# Patient Record
Sex: Male | Born: 1935 | Race: White | Hispanic: No | Marital: Married | Smoking: Former smoker
Health system: Southern US, Community
[De-identification: ages and names within clinical notes are randomized; demographics above are authoritative.]

## PROBLEM LIST (undated history)

## (undated) DIAGNOSIS — I10 Essential (primary) hypertension: Secondary | ICD-10-CM

## (undated) DIAGNOSIS — E119 Type 2 diabetes mellitus without complications: Secondary | ICD-10-CM

## (undated) DIAGNOSIS — Z87442 Personal history of urinary calculi: Secondary | ICD-10-CM

## (undated) DIAGNOSIS — R160 Hepatomegaly, not elsewhere classified: Secondary | ICD-10-CM

## (undated) DIAGNOSIS — E7800 Pure hypercholesterolemia, unspecified: Secondary | ICD-10-CM

## (undated) DIAGNOSIS — M109 Gout, unspecified: Secondary | ICD-10-CM

## (undated) DIAGNOSIS — K8689 Other specified diseases of pancreas: Secondary | ICD-10-CM

## (undated) DIAGNOSIS — C801 Malignant (primary) neoplasm, unspecified: Secondary | ICD-10-CM

## (undated) HISTORY — PX: ABDOMINAL AORTIC ANEURYSM REPAIR: SUR1152

## (undated) HISTORY — PX: CATARACT EXTRACTION, BILATERAL: SHX1313

## (undated) HISTORY — PX: HERNIA REPAIR: SHX51

## (undated) HISTORY — PX: PORTACATH PLACEMENT: SHX2246

## (undated) HISTORY — DX: Other specified diseases of pancreas: K86.89

---

## 2003-12-24 ENCOUNTER — Ambulatory Visit (HOSPITAL_COMMUNITY): Admission: RE | Admit: 2003-12-24 | Discharge: 2003-12-24 | Payer: Self-pay | Admitting: Gastroenterology

## 2003-12-24 ENCOUNTER — Encounter (INDEPENDENT_AMBULATORY_CARE_PROVIDER_SITE_OTHER): Payer: Self-pay | Admitting: *Deleted

## 2007-05-08 ENCOUNTER — Encounter: Admission: RE | Admit: 2007-05-08 | Discharge: 2007-05-08 | Payer: Self-pay | Admitting: Family Medicine

## 2007-05-09 ENCOUNTER — Ambulatory Visit: Payer: Self-pay | Admitting: Vascular Surgery

## 2007-05-09 ENCOUNTER — Inpatient Hospital Stay (HOSPITAL_COMMUNITY): Admission: AD | Admit: 2007-05-09 | Discharge: 2007-05-18 | Payer: Self-pay | Admitting: Vascular Surgery

## 2007-05-10 ENCOUNTER — Encounter: Payer: Self-pay | Admitting: Vascular Surgery

## 2007-05-11 ENCOUNTER — Encounter: Payer: Self-pay | Admitting: Vascular Surgery

## 2007-06-01 ENCOUNTER — Ambulatory Visit: Payer: Self-pay | Admitting: Vascular Surgery

## 2007-06-06 ENCOUNTER — Ambulatory Visit: Payer: Self-pay | Admitting: Vascular Surgery

## 2007-12-26 ENCOUNTER — Ambulatory Visit: Payer: Self-pay | Admitting: Vascular Surgery

## 2008-11-06 ENCOUNTER — Inpatient Hospital Stay (HOSPITAL_COMMUNITY): Admission: RE | Admit: 2008-11-06 | Discharge: 2008-11-07 | Payer: Self-pay | Admitting: General Surgery

## 2008-11-09 ENCOUNTER — Inpatient Hospital Stay (HOSPITAL_COMMUNITY): Admission: EM | Admit: 2008-11-09 | Discharge: 2008-11-13 | Payer: Self-pay | Admitting: Emergency Medicine

## 2009-01-21 ENCOUNTER — Ambulatory Visit: Payer: Self-pay | Admitting: Vascular Surgery

## 2010-01-29 ENCOUNTER — Ambulatory Visit: Payer: Self-pay | Admitting: Vascular Surgery

## 2010-07-10 LAB — URINALYSIS, ROUTINE W REFLEX MICROSCOPIC
Glucose, UA: NEGATIVE mg/dL
Nitrite: NEGATIVE
Specific Gravity, Urine: 1.023 (ref 1.005–1.030)
pH: 6 (ref 5.0–8.0)

## 2010-07-10 LAB — URINE MICROSCOPIC-ADD ON

## 2010-07-10 LAB — GLUCOSE, CAPILLARY
Glucose-Capillary: 106 mg/dL — ABNORMAL HIGH (ref 70–99)
Glucose-Capillary: 106 mg/dL — ABNORMAL HIGH (ref 70–99)
Glucose-Capillary: 109 mg/dL — ABNORMAL HIGH (ref 70–99)

## 2010-07-10 LAB — DIFFERENTIAL
Basophils Absolute: 0 10*3/uL (ref 0.0–0.1)
Basophils Absolute: 0 10*3/uL (ref 0.0–0.1)
Basophils Absolute: 0 10*3/uL (ref 0.0–0.1)
Basophils Absolute: 0 10*3/uL (ref 0.0–0.1)
Eosinophils Absolute: 0 10*3/uL (ref 0.0–0.7)
Eosinophils Absolute: 0.4 10*3/uL (ref 0.0–0.7)
Eosinophils Relative: 0 % (ref 0–5)
Eosinophils Relative: 1 % (ref 0–5)
Eosinophils Relative: 3 % (ref 0–5)
Lymphocytes Relative: 14 % (ref 12–46)
Lymphocytes Relative: 5 % — ABNORMAL LOW (ref 12–46)
Lymphocytes Relative: 6 % — ABNORMAL LOW (ref 12–46)
Lymphs Abs: 0.5 10*3/uL — ABNORMAL LOW (ref 0.7–4.0)
Monocytes Absolute: 0.4 10*3/uL (ref 0.1–1.0)
Monocytes Absolute: 1 10*3/uL (ref 0.1–1.0)
Monocytes Relative: 6 % (ref 3–12)
Neutro Abs: 4.4 10*3/uL (ref 1.7–7.7)
Neutrophils Relative %: 83 % — ABNORMAL HIGH (ref 43–77)

## 2010-07-10 LAB — BASIC METABOLIC PANEL
BUN: 15 mg/dL (ref 6–23)
BUN: 23 mg/dL (ref 6–23)
CO2: 27 mEq/L (ref 19–32)
CO2: 29 mEq/L (ref 19–32)
Chloride: 103 mEq/L (ref 96–112)
Chloride: 104 mEq/L (ref 96–112)
Creatinine, Ser: 0.85 mg/dL (ref 0.4–1.5)
GFR calc Af Amer: >60 mL/min (ref >60)
GFR calc non Af Amer: >60 mL/min (ref >60)
GFR calc non Af Amer: >60 mL/min (ref >60)
Glucose, Bld: 107 mg/dL — ABNORMAL HIGH (ref 70–99)
Glucose, Bld: 112 mg/dL — ABNORMAL HIGH (ref 70–99)
Glucose, Bld: 74 mg/dL (ref 70–99)
Potassium: 3.1 mEq/L — ABNORMAL LOW (ref 3.5–5.1)
Potassium: 3.7 mEq/L (ref 3.5–5.1)
Potassium: 3.9 mEq/L (ref 3.5–5.1)
Sodium: 136 mEq/L (ref 135–145)
Sodium: 136 mEq/L (ref 135–145)

## 2010-07-10 LAB — COMPREHENSIVE METABOLIC PANEL
AST: 26 U/L (ref 0–37)
Albumin: 3.1 g/dL — ABNORMAL LOW (ref 3.5–5.2)
BUN: 14 mg/dL (ref 6–23)
CO2: 28 mEq/L (ref 19–32)
Chloride: 101 mEq/L (ref 96–112)
Chloride: 89 mEq/L — ABNORMAL LOW (ref 96–112)
Creatinine, Ser: 0.86 mg/dL (ref 0.4–1.5)
Creatinine, Ser: 1.4 mg/dL (ref 0.4–1.5)
GFR calc Af Amer: >60 mL/min (ref >60)
GFR calc non Af Amer: >60 mL/min (ref >60)
Glucose, Bld: 181 mg/dL — ABNORMAL HIGH (ref 70–99)
Potassium: 3.2 mEq/L — ABNORMAL LOW (ref 3.5–5.1)
Total Bilirubin: 0.8 mg/dL (ref 0.3–1.2)
Total Bilirubin: 1.7 mg/dL — ABNORMAL HIGH (ref 0.3–1.2)
Total Protein: 6.2 g/dL (ref 6.0–8.3)

## 2010-07-10 LAB — CBC
HCT: 36.3 % — ABNORMAL LOW (ref 39.0–52.0)
HCT: 37.6 % — ABNORMAL LOW (ref 39.0–52.0)
HCT: 39.5 % (ref 39.0–52.0)
Hemoglobin: 12.4 g/dL — ABNORMAL LOW (ref 13.0–17.0)
MCHC: 32.9 g/dL (ref 30.0–36.0)
MCV: 96.5 fL (ref 78.0–100.0)
MCV: 97.2 fL (ref 78.0–100.0)
MCV: 97.3 fL (ref 78.0–100.0)
MCV: 97.6 fL (ref 78.0–100.0)
Platelets: 165 10*3/uL (ref 150–400)
Platelets: 208 10*3/uL (ref 150–400)
RBC: 4.05 MIL/uL — ABNORMAL LOW (ref 4.22–5.81)
RDW: 13.3 % (ref 11.5–15.5)
RDW: 13.4 % (ref 11.5–15.5)
RDW: 13.5 % (ref 11.5–15.5)
WBC: 5.9 10*3/uL (ref 4.0–10.5)
WBC: 5.9 10*3/uL (ref 4.0–10.5)
WBC: 7.7 10*3/uL (ref 4.0–10.5)

## 2010-07-10 LAB — HEMOGLOBIN A1C
Hgb A1c MFr Bld: 6.3 % — ABNORMAL HIGH (ref 4.6–6.1)
Mean Plasma Glucose: 134 mg/dL

## 2010-07-10 LAB — URINE CULTURE

## 2010-07-10 LAB — CULTURE, BLOOD (ROUTINE X 2): Culture: NO GROWTH

## 2010-08-17 NOTE — H&P (Signed)
[name], [name]                 ACCOUNT NO.:  [id number]   MEDICAL RECORD NO.:  [id number]          PATIENT TYPE:  INP   LOCATION:  0101                         FACILITY:  [facility]   PHYSICIAN:  Corinna L. Lendell Caprice, MDDATE OF BIRTH:  [birth year]   DATE OF ADMISSION:  11/09/2008  DATE OF DISCHARGE:                              HISTORY & PHYSICAL   CHIEF COMPLAINT:  Shortness of breath.   HISTORY OF PRESENT ILLNESS:  [name] is a 77 year old white male who  had a laparoscopic ventral hernia repair on November 06, 2008 by Dr.  Dwain Sarna.  Since then, he has not had any flatus or bowel movement.  He  has not been able to eat much and he has become more bloated.  He denies  nausea, but has had hiccups for 12 hours.  He also in the middle of the  night woke up and was spitting up black material.  He denies emesis but  does report reflux.  It sounds as if while asleep he suddenly became  short of breath with a lot of chest congestion and black material seemed  to be emanating from his chest, mouth and nose.  The details are  somewhat sketchy, but the wife reports that he did seem to have chest  congestion.  He has also developed a cough since last night.  He has had  no fevers or chills.  He had no cough or shortness of breath prior to  last night.  He has an abdominal binder on from the procedure which is  causing some discomfort and shortness of breath due to the abdominal  distention.  When he woke up in the middle of the night, he felt very  weak and dizzy.  On arrival to the emergency room he was found to have a  blood pressure of 81/49 and an oxygen saturation of 81%.  He received 2  liters of fluid and his pressure is now 125/70.  On oxygen, his  saturation is 95%.  He is feeling better but he is still having hiccups.  He denies any nausea currently.  The ED physician spoke with Dr. Gerrit Friends  who is on call for Dr. Dwain Sarna.  He will consult but prefers admission  to medicine.   PAST  MEDICAL HISTORY:  1. Abdominal aortic aneurysm repair in February 2009 with      postoperative ileus, hyperglycemia and hyponatremia at that time.  2. Hypertension.  3. Hyperlipidemia.  4. Gout.  5. Previously diet-controlled diabetes.   MEDICATIONS:  1. Allopurinol 30 mg daily.  2. Lisinopril 20/12.5 ng daily.  3. Verapamil 240 mg daily.  4. Pravastatin 80 mg daily.  5. Zetia 10 mg daily.   ALLERGIES:  No known drug allergies.   SOCIAL HISTORY:  The patient is retired.  He is married.  He does not  drink.  He has no history of drug abuse.  He smoked for a about 10 years  many years ago.   FAMILY HISTORY:  He is adopted.   REVIEW OF SYSTEMS:  As above, otherwise negative.   PHYSICAL  EXAMINATION:  Temperature is 98.5, blood pressure 81/49, pulse  90, respiratory rate 20, oxygen saturation 81% on room air initially,  currently 95%.  Blood pressure currently 125/70, pulse 81, respiratory  rate currently about 16, initially 22.  IN GENERAL:  The patient is hiccupping but otherwise in no acute  distress.  HEENT:  Normocephalic, atraumatic.  Pupils equal, round, reactive to  light.  Sclerae nonicteric.  Moist mucous membranes.  Oropharynx is  without erythema or exudate.  NECK:  Supple.  No JVD.  LUNGS:  Diminished on the right.  No rhonchi, rales or wheeze.  Left  side is clear.  CARDIOVASCULAR:  Regular rate and rhythm without murmurs, gallops or  rubs.  ABDOMEN:  Very distended.  No bowel sounds, soft, minimal tenderness.  No rebound tenderness.  EXTREMITIES:  No clubbing, cyanosis or edema.  GU/RECTAL:  Deferred.  NEUROLOGIC:  Alert and oriented.  Cranial nerves and sensorimotor exams  are intact.  PSYCHIATRIC:  Normal affect.   LABORATORIES:  CBC is unremarkable with 88% neutrophils, 5% lymphocytes.  Sodium 124, on August 3 it was 135.  Potassium 3.2, on August 3 it was  3.8.  Chloride 89, on August 3 it was 101.  Bicarbonate 23, glucose 224,  on August 3 it was 181,  BUN 28, on August 3 it was 14.  Creatinine 1.40,  on August 3 it was 0.86.  Total bilirubin is 1.7, alkaline phosphatase  36, albumin 3.1.  LFTs otherwise unremarkable.  Venous lactic acid level  normal at 2.1.  Urinalysis shows a specific gravity of 1.023, trace  ketones, trace blood, trace protein, negative nitrite, trace leukocyte  esterase, 0-2 white cells, 0-2 red cells.  Urine cultures pending.  Blood cultures pending.  Two views of the chest show new extensive right  lung air space opacities suggesting pneumonia.  Two views of the abdomen  show distended small bowel loops with fluid levels suggesting early  partial obstruction versus ileus, no free air.   ASSESSMENT/PLAN:  1. Pneumonia:  Based on history of an ileus, suspect aspiration.  The      patient has received a dose of vancomycin and Zosyn.  Continue      Zosyn.  Continue oxygen.  His pressure is better and he appears      nontoxic.  His oxygen saturation also is better but I will monitor      on telemetry.  2  Postoperative ileus:  Dr. Gerrit Friends has been consulted.  I will start  Reglan and place an NG tube and correct electrolyte disturbances.  The  patient has not had much pain medication but I will give as needed small  doses of Dilaudid.  He also describes some substernal burning and may  have some reflux or esophagitis and I will give intravenous Protonix.  1. Resolved hypoxemia and hypotension.  2. Dehydration.  3. Hypertension:  Hold antihypertensives.  4. Hyponatremia secondary to dehydration, hydrochlorothiazide with      pneumonia and ileus contributing:  This should correct with saline      and treatment of his above problems as well as stopping his      hydrochlorothiazide.  5. Hyperlipidemia.  Hold statin.  6. Diabetes:  Previously diet-controlled but I suspect his hemoglobin      A1c may be higher than 7.  I will give sliding scale insulin and      check hemoglobin A1c for now.  He may need an oral agent  started at  some point      once he is eating.  7. Hypokalemia.  This will be repleted IV.  Check a magnesium level.  8. History of abdominal aortic aneurysm repair.      Corinna L. Lendell Caprice, MD  Electronically Signed     CLS/MEDQ  D:  11/09/2008  T:  11/09/2008  Job:  [id number]   cc:   Juanetta Gosling, MD  [address]  [place] [address]   Chales Salmon. Abigail Miyamoto, M.D.  Fax: [phone]

## 2010-08-17 NOTE — Op Note (Signed)
[name], [name]                 ACCOUNT NO.:  [id number]   MEDICAL RECORD NO.:  [id number]          PATIENT TYPE:  INP   LOCATION:  2024                         FACILITY:  [facility]   PHYSICIAN:  Janetta Hora. Fields, MD  DATE OF BIRTH:  Mar 27, 1934   DATE OF PROCEDURE:  05/14/2007  DATE OF DISCHARGE:                               OPERATIVE REPORT   PROCEDURE:  Repair of abdominal aortic aneurysm.   PREOPERATIVE DIAGNOSIS:  Abdominal aortic aneurysm.   POSTOPERATIVE DIAGNOSIS:  Abdominal aortic aneurysm.   ANESTHESIA:  General.   ASSISTANT:  Carol Ada, RNFA, Gae Bon, PA-C, Delight Hoh,  RNFA   OPERATIVE FINDINGS:  1. 18 mm Dacron graft.  2. Accessory left renal artery with clamping just above this.   OPERATIVE DETAILS:  After obtaining informed consent, the patient taken  to the operating room.  The patient placed supine position operating  table.  After induction of general anesthesia and endotracheal  intubation, the patient was prepped from the nipples down to level of  the knees.  Next a midline laparotomy incision was made extending from  the xiphoid to the pubis.  This was carried down through subcutaneous  tissues and peritoneum was opened for the full length of the incision.  Next the small bowel was reflected to the right.  The transverse colon  and greater omentum were reflected superiorly.  There was a very large  infrarenal abdominal aortic aneurysm approximately 12 cm in diameter.  Dissection was carried along the retroperitoneum up to the neck of the  aorta.  The left renal vein was identified and retracted superiorly.  There was approximately 2 mm left accessory renal artery coming off the  neck of the aneurysm.  The main renal artery came off just above this.  This was identified and protected.  Right renal artery was also  identified and protected.  Next dissection was carried down to level of  the inferior mesenteric artery.  This was dissected  free  circumferentially and vessel loop placed around this.  Dissection was  then carried down to level of the iliac arteries.  The anterior surface  of the left and right common iliac arteries was dissected free in order  to allow clamping.  The patient was then given 5000 units of intravenous  heparin as well as 25 grams of mannitol.  The left and right common  iliac arteries were then clamped.  Vessel loop was pulled taut on the  inferior mesenteric artery.  The aortic clamp was placed above the left  accessory renal artery but below the left main renal artery.  The  aneurysm was then opened.  There was several lumbar arteries at the  proximal neck which were ligated with 2-0 silk sutures.  There also a  few lumbar arteries at the distal aspect of the aneurysm and these were  controlled with 2-0 silk sutures.  Next an 18 mm Dacron graft was  brought up to the operative field and this was sewn end of graft to the  end of aorta using a running 3-0 Prolene suture.  The graft was sewn  abutting the origin of the inferior pole left renal artery.  At  completion, anastomosis was tested and found be hemostatic.  Clamp was  then removed.  There was a small area of bleeding on the aortic surface  just above the suture on the right side.  This was repaired with a  single 3-0 Prolene pledgeted stitch.  The graft was then clamped below  the anastomosis.  Attention was then turned to the distal anastomosis.  The graft was cut to length and sewn end of graft to the aortic  bifurcation using a running 3-0 Prolene suture.  Just prior to  completion of anastomosis everything was fore bled, back bled and  thoroughly flushed.  Anastomosis was secured, clamp was released with  flow first restored to the right leg with transient drop in blood  pressure.  This quickly recovered.  The left leg was then opened and  also there was transient drop in blood pressure and this quickly  recovered.  Graft was  inspected and found to be hemostatic.  The  inferior mesenteric artery was inspected as the vessel loop was loosened  and there was a vigorous back bleeding from the IMA.  This was then  ligated with 2-0 silk.  The patient was given 70 mg of protamine.  The  patient also been given an additional dose of heparin intravenously  during the case.  After hemostasis had been adequate the aortic sac was  closed with running 3-0 Prolene suture.  The retroperitoneum was also  closed with running 3-0 Prolene suture.  Viscera returned to normal  position.  Fascia was closed with a running #1 PDS suture.  Skin was  closed with staples.  The patient tolerated procedure well and there  were no complications.  Feet were warm at end of the case.  The patient  taken to the recovery room extubated and in stable condition.      Janetta Hora. Fields, MD  Electronically Signed     CEF/MEDQ  D:  05/14/2007  T:  05/15/2007  Job:  [id number]

## 2010-08-17 NOTE — Assessment & Plan Note (Signed)
OFFICE VISIT   TRAYVEN, LUMADUE  DOB:  [birth year]                                       05/09/2007  VHQIO#:[id number]   SUMMARY:  A full history and physical and admit note was dictated into  the [facility] system for this office visit.   Janetta Hora. Fields, MD  Electronically Signed   CEF/MEDQ  D:  05/10/2007  T:  05/11/2007  Job:  [id number]

## 2010-08-17 NOTE — Assessment & Plan Note (Signed)
OFFICE VISIT   [name], [name]  DOB:  [birth year]                                       01/21/2009  ZOXWR#:[id number]   CHIEF COMPLAINT:  Aneurysm followup.   HISTORY OF PRESENT ILLNESS:  The patient is status post repair of  abdominal aortic aneurysm in February of 2009.  He presents today for  further followup.  He denies any abdominal or back pain.  Overall he has  been doing well.  He has returned to his normal activities and his  normal appetite.  His weight is stable overall.  He did have repair of  his ventral hernia by Dr. Dwain Sarna in August of 2010.   Atherosclerotic risk factors continue to remain diabetes, hypertension,  elevated cholesterol.   REVIEW OF SYSTEMS:  Full review of systems was performed and was  positive only for some weight loss since his aneurysm operation.  He is  currently 6 feet 1 inch, 168 pounds.  He also has some easy bleeding  most likely secondary to his aspirin.  All other review of systems were  negative.  Please see intake referral form for details.   FAMILY HISTORY:  Is unremarkable.   SOCIAL HISTORY:  He is married and has three children.  Is a former  smoker and quit over 30 years ago.   PHYSICAL EXAM:  Vital signs:  Blood pressure 163/72 in the left arm,  pulse is 52 and regular.  Temperature is 97.9, oxygen saturation 100% on  room air.  HEENT:  Unremarkable.  Neck:  Has 2+ carotid pulses without  bruit.  Chest:  Clear to auscultation.  He has no cervical  lymphadenopathy.  Cardiac:  Is regular rate and rhythm without murmur.  Abdomen:  Soft, nontender, nondistended.  No hernia with a well-healed  midline laparotomy scar.  Extremities:  He has 2+ femoral pulses with  absent popliteal and pedal pulses bilaterally.  Feet are pink, warm and  well-perfused bilaterally.   He had an aortic ultrasound today which shows that his anastomoses are  patent without any evidence of renarrowing.  ABI on the left side was  1.15, ABI on the right was 0.99 and waveforms were triphasic  bilaterally.   ASSESSMENT:  The patient has recovered from his abdominal aortic  aneurysm repair.  His repair looks good under ultrasound today.  He has  normal ABIs bilaterally.  He will follow up in 1 year.  If everything  looks good at that point we will probably go to once every 5 year  evaluation.   Janetta Hora. Fields, MD  Electronically Signed   CEF/MEDQ  D:  01/22/2009  T:  01/23/2009  Job:  [id number]   cc:   Chales Salmon. Abigail Miyamoto, M.D.

## 2010-08-17 NOTE — H&P (Signed)
NAMEGERRELL, [name]                 ACCOUNT NO.:  [id number]   MEDICAL RECORD NO.:  [id number]          PATIENT TYPE:  OUT   LOCATION:  CATS                         FACILITY:  [facility]   PHYSICIAN:  Janetta Hora. Fields, MD  DATE OF BIRTH:  [birth year]   DATE OF ADMISSION:  05/09/2007  DATE OF DISCHARGE:                              HISTORY & PHYSICAL   HISTORY OF PRESENT ILLNESS:  The patient is a 77 year old male referred  by Dr. Abigail Miyamoto for asymptomatic large abdominal aortic aneurysm.  The  patient was having a routine physical and was noted to have a palpable  pulsatile epigastric mass.  Follow up ultrasound was done yesterday.  It  showed an infrarenal abdominal aortic aneurysm which was 9.4 x 10.2 cm  in diameter.  The patient denies any abdominal or back pain.  His  atherosclerotic risk factors include hypertension and elevated  cholesterol.  He denies a history of coronary artery disease.  He does  have a history of smoking but quit in 1971.  He has a history of  diabetes.  He does not take insulin.   FAMILY HISTORY:  He has no family history of abdominal aortic aneurysm  but was adopted and does not know his complete family history.   SOCIAL HISTORY:  He is married and has four children.  He is a retired  Airline pilot.  Smoking history is as above.  He does not consume alcohol  regularly.   REVIEW OF SYSTEMS:  He is 6 foot 1, 184 pounds.  He is able to climb two  flights of stairs without shortness of breath.  He has no chest pain or  tightness.  He has no history of asthma, wheezing, GI bleeding, peptic  ulcer disease, stroke, TIA, syncopal episodes or seizures.  He does have  a history of urinary frequency and had some microscopic hematuria on a  urinalysis recently.  Denies any bleeding problems.   PAST SURGICAL HISTORY:  Remarkable for a cataract in his left eye.   ALLERGIES:  HE HAS NO KNOWN DRUG ALLERGIES.   MEDICATIONS:  Include:  1. Pravachol 40 mg once a day.  2.  Zestoretic/lisinopril 20/12.5 once a day.  3. Calan SR 240 mg once a day.  4. Allopurinol 300 mg once a day.   PHYSICAL EXAMINATION:  VITAL SIGNS:  Blood pressure is 156/89 in the  right arm, pulse is 75 and regular.  HEENT:  Unremarkable.  NECK:  2+ carotid pulses without bruits.  CHEST:  Clear to auscultation.  CARDIAC EXAM:  Regular rate and rhythm without murmur.  ABDOMEN:  Soft, nontender, nondistended with an easily palpable  pulsatile mass in the epigastrium.  This is consistent with a 10 cm  aneurysm.  It is nontender.  He has 3+ femoral, 2+ popliteal pulses  bilaterally.  He has a 3+ right posterior tibial pulse with absent right  dorsalis pedis pulse.  He has absent pedal pulses in the left foot.  He  has bilateral varicose veins with no significant edema in the lower  extremities.   ASSESSMENT/PLAN:  I reviewed  his ultrasound of the abdomen  which shows  a large abdominal aortic aneurysm.  It appears to be infrarenal in  character and ends at the aortic bifurcation.   I had a lengthy discussion with [name] today regarding repair of his  abdominal aortic aneurysm.  I described to him endovascular and open  repair methods.  I told him that first we would need to get a CT scan of  the abdomen and pelvis.  I also informed him we would bring him in as an  inpatient to evaluate this and also do his remaining workup for his  aneurysm repair as an inpatient due to the large size of the aneurysm.  I also discussed with him the risk of rupture of a 10 cm aneurysm.  The  risks, benefits, possible complications and procedure details of  endovascular stent grafting as well as open repair were discussed with  the patient.  I will discuss with him further tomorrow, as we have the  CT angiogram, on which he would be a best candidate for.  Tentatively,  he is scheduled for operative repair of this on Friday, February 6.      Janetta Hora. Fields, MD  Electronically Signed      CEF/MEDQ  D:  05/09/2007  T:  05/09/2007  Job:  [id number]   cc:   Chales Salmon. Abigail Miyamoto, M.D.

## 2010-08-17 NOTE — Assessment & Plan Note (Signed)
OFFICE VISIT   [name], [name]  DOB:  1933/05/05                                       05/09/2007  ZOXWR#:[id number]   A full history and physical was dictated into the [facility] system for this  office visit.   Janetta Hora. Fields, MD  Electronically Signed   CEF/MEDQ  D:  05/10/2007  T:  05/11/2007  Job:  [id number]

## 2010-08-17 NOTE — Procedures (Signed)
DUPLEX ULTRASOUND OF ABDOMINAL AORTA   INDICATION:  Followup abdominal aortic aneurysm repair.   HISTORY:  Diabetes:  Yes.  Cardiac:  No.  Hypertension:  Yes.  Smoking:  Previous.  Connective Tissue Disorder:  Family History:  No.  Previous Surgery:  Open repair of AAA on 05/14/2007.   DUPLEX EXAM:         AP (cm)                   TRANSVERSE (cm)  Proximal             Not visualized            Not visualized  Mid                  2.7 cm                    2.7 cm  Distal               2.8 cm                    2.8 cm  Right Iliac          1.5 cm                    cm  Left Iliac           1.3 cm                    cm   PREVIOUS:  Date:  AP:  TRANSVERSE:   IMPRESSION:  1. Patent abdominal aortic aneurysm repair site with no evidence of      stenosis or aneurysmal dilatation of the native vessels.  2. Unable to adequately visualize the proximal aorta due to overlying      bowel gas patterns.   ___________________________________________  Janetta Hora Fields, MD   CH/MEDQ  D:  02/01/2010  T:  02/01/2010  Job:  [id number]

## 2010-08-17 NOTE — Assessment & Plan Note (Signed)
OFFICE VISIT   TYKE, OUTMAN  DOB:  [birth year]                                       12/26/2007  ZOXWR#:[id number]   The patient returns for followup today.  He had repair of an abdominal  aortic aneurysm in February of 2009.  He has completely recovered from  this at this point and is back to his normal activities.  He denies any  abdominal or back pain.   PHYSICAL EXAMINATION:  On physical exam today blood pressure is 144/74  in the left arm, pulse is 60 and regular.  Abdomen is soft, nontender,  nondistended.  He has an approximately 3 cm hernia defect approximately  4 cm above the umbilicus.  There is no evidence of incarceration and it  spontaneously reduces.  It is not painful to him.  He has 2+ femoral  pulses bilaterally.  Chest is clear to auscultation.  Cardiac exam is  regular rate and rhythm.   Overall, the patient is doing well.  Since his hernia is not very large  and it is asymptomatic at this point I have counseled him that we should  leave it alone for now.  He is okay with this.  He will follow up with  me in one year's time or sooner if the hernia becomes larger.   Janetta Hora. Fields, MD  Electronically Signed   CEF/MEDQ  D:  12/26/2007  T:  12/27/2007  Job:  [id number]   cc:   Chales Salmon. Abigail Miyamoto, M.D.

## 2010-08-17 NOTE — Discharge Summary (Signed)
[name], [name]                 ACCOUNT NO.:  [id number]   MEDICAL RECORD NO.:  [id number]          PATIENT TYPE:  INP   LOCATION:  2024                         FACILITY:  [facility]   PHYSICIAN:  Janetta Hora. Fields, MD  DATE OF BIRTH:  1933/12/08   DATE OF ADMISSION:  05/09/2007  DATE OF DISCHARGE:  05/18/2007                               DISCHARGE SUMMARY   ADMISSION DIAGNOSIS:  Large abdominal aortic aneurysm.   DISCHARGE/SECONDARY DIAGNOSES:  1. Large infrarenal abdominal aortic aneurysm, 10 cm, status post open      repair.  2. Postoperative ileus, clinically improved.  3. Postoperative hyperglycemia with history of diet-controlled      diabetes mellitus type 2.  4. Hypokalemia, supplemented.  5. Leukocytosis, most likely reactive, improved.  6. History of tobacco use, but quit in 1971.  7. History of hypertension.  8. Hyperlipidemia.  9. No known drug allergies.  10.History of left eye surgery for cataracts.  11.Postoperative respiratory alkalosis, clinically improved.  12.Hyponatremia.   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   BRIEF HISTORY:  [name] a 77 year old Caucasian male referred to Dr.  Fabienne Bruns by Dr. Abigail Miyamoto for an asymptomatic large abdominal aortic  aneurysm.  The patient was having a routine physical and was noted to  have a palpable pulsatile epigastric mass.  Followup ultrasound showed  an infrarenal abdominal aortic aneurysm measuring 9.4 x 10.2 cm in  diameter.  The patient had no abdominal or back pain.  Medical history  as above. He was referred to a vascular surgeon, Dr. Fabienne Bruns on  May 09, 2007, the day after his ultrasound due to its size.  Dr.  Darrick Penna recommended that he be admitted to [facility] and  undergo remaining work up to see if he was a candidate for endovascular  versus open repair and  also due to risk of rupture with known centimeter  aneurysm.   HOSPITAL COURSE:  Mr. [name] was admitted to [facility] on  May 09, 2007.  He underwent a CTA of the abdomen and pelvis which  showed a huge abdominal aortic aneurysm in the infrarenal location and  measuring 10.5 and 11.6 cm in greatest dimension.  No significant neck  was present above the aneurysm to the level of the lowest renal artery  which was a lower pole left renal artery that originated 5 mm proximal  to the origin of the aneurysm.  Two left renal arteries were present and  2 of 3 right renal arteries were present.  Reports that it was difficult  to determine whether 2 small accessory vessels originate separately off  of the abdominal aorta on the right, or whether one might originate  nearly simultaneously within the main renal artery.  None of the right  renal arteries was located near the origin of the aneurysm.  No other  significant distal vessels were identified.  Inferior mesenteric artery  remained open.  There is no signs of aneurysmal  leak or inflammatory  aneurysm.  The iliac arteries was noted to have tortuosity without  stenosis or occlusion.  Common iliac arteries were mildly and diffusely  dilated measuring 15 mm in estimated maximal diameter just above their  origins.  Based on these results it was felt that he was not a candidate  for stent graft, and therefore would undergo open repair.  In the  meantime, he had postoperative ABIs which showed 1.23 on the right and  1.23 on the left.  He also had a 2-D echocardiogram, which showed  overall left ventricular systolic function was normal, left ventricular  ejection fraction was estimated at 60%.  There was no left ventricular  regional wall motion abnormalities.  The left ventricular wall thickness  was moderately increased.  There is mild aortic root dilatation.  On  May 11, 2007, Mr. [name] was taken to the operating room as   anticipated.  Of note, intra-aortic clamped was clamped just above the  left accessory renal.  Postoperatively he was extubated in the operating  room, but after about 2 hours in the recovery unit he developed  increasing agitation which was treated with Dilaudid and Versed.  He  later became lethargic with decreased respirations.  SAO2 was decreased  to 50% and therefore he was reintubated in the recovery unit by  anesthesia when his saturation recovered promptly to 100%.  He was then  transferred to the Surgical Intensive Care Unit.  He was treated for  respiratory alkalosis on SIMV overnight in the unit.  On postop day #1,  however, he was felt appropriate for extubation.  Labs did show some  hypokalemia and hyponatremia which were supplemented.  Otherwise his  vitals remained stable.  Hemoglobin was at 11.  Urine output was  adequate.  Oxygen saturations were above 90%.  Feet remained warm and  abdomen was soft, no bowel sounds present initially.  On postop day #2  he was felt appropriate to transfer to the telemetry unit 2000 where it  was anticipated he will remain until discharge.  At that point he was  still putting out a fair amount of stomach secretions and the  nasogastric tube was kept in place.  Abdomen was distended still with no  good bowel sounds; therefore it was felt that he had an ileus.  He  remained on NPO status with IV fluids for hydration.  By postoperative  day #5, however, nasogastric tube output was improving, abdomen was no  longer distended, and bowel sounds were present.  He also had a  postoperative bowel movement.  Subsequently the nasogastric tube was  discontinued.  After several hours he had no nausea or vomiting or  increased distention, and he was advanced to a clear liquid diet.  On  postop day #6 he was then advanced to a regular diet.  If he tolerates a  regular diet we anticipate he will be ready for discharge within the  next 24-to-48 hours.  Of  note during this hospitalization he was also  monitored for leukocytosis with a white count up to about 16,000.  There  was, however, no signs of infection and serial WBCs showed a trend and  most recently down to 11.6.  He remained afebrile and was felt the  transient elevation was most likely reactive related to surgery.  Of  note with his history of diabetes, he was treated with NovoLog insulin  sliding scale throughout his hospitalization.  His blood sugars have  averaged  around 120 to 140.  He will be instructed to follow up with  family physician regarding possible need to start on long-term  medication.  As far as his hyponatremia, this did improve.  However, he  did require several other supplements of potassium.  At the time of  dictation his incisions were healing well without signs of infection.  Abdomen is soft, nontender with good bowel sounds.  Vitals remained  stable showing blood pressure of 118/60, heart rate in the 60s to 80s in  sinus rhythm.  He is afebrile and oxygen saturation 95% on room air.  He  reports that he is comfortable.  He has been ambulating without  difficulty.  His bowel and bladder function has been returning.  Of note  he did report some mild sore throat and was his pharyngitis treated with  Chloraseptic spray as needed as did not appear to have an acute  infection.  There was no evidence of thrush or significant erythema or  any white patches noted.   DISPOSITION:  If no significant change in status, anticipated Mr. Seeley  will be discharged postoperative day #7 or #8, February 13 or May 19, 2007.  Currently he remains in stable condition.   Most recent labs:  His February 11 lab results showed a white count of  11.6, hemoglobin 11, hematocrit 32.6, platelet count of 219.  Sodium  136, potassium 3.7, chloride 105, CO2 26, BUN 12, creatinine 0.69, blood  glucose 117.  Immediate postoperative LFTs showed an AST of 19, ALT of  50, alkaline  phosphatase of 35, total bilirubin 0.8, and amylase of 133,  albumin 2.   DISCHARGE MEDICATIONS:  1. Allopurinol 300 mg daily.  2. Calan 240 mg daily.  3. Pravachol 40 mg daily.  4. Zetia 20 mg daily.  5. Aspirin 81 mg daily.  6. Lisinopril 20 mg daily.  7. Hydrochlorothiazide 12.5 mg daily.  8. Percocet 5/325 mg 1 tablet p.o. every 6 hours p.r.n. pain.  9. Pepcid 20 mg p.o. every 12 hours, may decrease to as needed status      as tolerated.   DISCHARGE INSTRUCTIONS:  Continue preoperative diet.  Increase activity  as tolerated.  May shower, clean incision with soap and water.  Avoid  driving or heavy lifting for the next 3 weeks.  Call if he develops  fever greater than 101, redness or drainage from his incision site,  persistent nausea or vomiting or abdominal pain.  See Dr. Darrick Penna at the  [facility] office in approximately 3 weeks with ABIs.  Have staple removal on  May 28, 2007.  Our office will contact him regarding specific  appointment date and times.  He should also call and schedule followup  appointment with Dr. Abigail Miyamoto within the next month to reevaluate his  blood sugar.      Jerold Coombe, P.A.      Janetta Hora. Fields, MD  Electronically Signed    AWZ/MEDQ  D:  05/17/2007  T:  05/19/2007  Job:  [id number]   cc:   Janetta Hora. Darrick Penna, MD  Chales Salmon. Abigail Miyamoto, M.D.

## 2010-08-17 NOTE — Procedures (Signed)
AORTA-ILIAC DUPLEX EVALUATION   INDICATION:  Followup of AAA stent.   HISTORY:  Diabetes:  Yes.  Cardiac:  No.  Hypertension:  Yes.  Smoking:  Previous.  Previous Surgery:  AAA stent.               SINGLE LEVEL ARTERIAL EXAM                              RIGHT                  LEFT  Brachial:                  137                    128  Anterior tibial:           113                    129  Posterior tibial:          135                    158  Peroneal:  Ankle/brachial index:      0.99                   1.15  Previous ABI/date:         1.28                   1.29   AORTA-ILIAC DUPLEX EXAM  Aorta - Proximal     2.8 x 2.6 cm/s  Aorta - Mid          3 x 2.7 cm/s  Aorta - Distal       3.1 x 2.9 cm/s   RIGHT                                   LEFT  0.99 cm/s         CIA-PROXIMAL          0.95 cm/s  0.45 cm/s         CIA-DISTAL            0.97 cm/s  cm/s              HYPOGASTRIC           cm/s  0.95 cm/s         EIA-PROXIMAL          0.45 cm/s  cm/s              EIA-MID               cm/s  0.64 cm/s         EIA-DISTAL            1.08 cm/s   IMPRESSION:  1. Abdominal aortic aneurysm iliac stent appears patent with no      evidence of stenosis.  2. Ankle brachial indices within normal limits.  3. Hypogastric atery not visualized due to acoustic shadowing.      ___________________________________________  Janetta Hora. Fields, MD   CJ/MEDQ  D:  01/21/2009  T:  01/21/2009  Job:  [id number]

## 2010-08-17 NOTE — Op Note (Signed)
NAMEDEMONTREZ, RINDFLEISCH                 ACCOUNT NO.:  [id number]   MEDICAL RECORD NO.:  [id number]          PATIENT TYPE:  INP   LOCATION:  0006                         FACILITY:  [facility]   PHYSICIAN:  Juanetta Gosling, MDDATE OF BIRTH:  [birth year]   DATE OF PROCEDURE:  11/06/2008  DATE OF DISCHARGE:                               OPERATIVE REPORT   PREOPERATIVE DIAGNOSIS:  Ventral hernia.   POSTOPERATIVE DIAGNOSIS:  Ventral hernia.   PROCEDURE:  Laparoscopic ventral hernia repair with 20 x 25 cm Proceed  mesh.   SURGEON:  Harden Mo, M.D.   ASSISTANT:  Marca Ancona, M.D.   ANESTHESIA:  General.   FINDINGS:  An 8 x 10 cm ventral wall defect.   SPECIMENS:  None.   DRAINS:  None.   SUPERVISING ANESTHESIOLOGIST:  Dr. Brayton Caves.   ESTIMATED BLOOD LOSS:  Minimal.   COMPLICATIONS:  None.   DISPOSITION:  To recovery room in stable condition.   INDICATIONS:  Mr. Janoski is a 77 year old male with underwent an aneurysm  repair by Dr. Darrick Penna in February, 2009 and was noted to have an increase  in size of the abdominal vault around his incision.  He is wearing a  binder to help the symptoms of pain at this time and is increasing in  size.  He does complain if he eats too much, it will become hard and  tender, but he is able to reduce this every time it happens.  On his  examination, he had about an 8 x 4 cm reducible incisional hernia.  He  and I discussed a laparoscopic ventral hernia repair and the risks and  benefits of that procedure in detail prior to operation.   PROCEDURE:  After informed consent was obtained, the patient was taken  to the operating room.  He was administered 1 gram of intravenous  cefazolin.  Sequential compression devices were placed on the lower  extremities prior to induction.  He was then placed under general  endotracheal anesthesia without complication.  Foley catheter and  orogastric tube were then placed.  His abdomen was prepped with  ChloraPrep.  Three minutes were allowed to pass.  He was then draped in  a standard sterile surgical fashion.  Collier Flowers was then also placed over  these drapes.  A surgical time-out was then performed.   A 10-mm incision was then made in the left upper quadrant.  Dissection  carried out down to the level of external abdominal oblique.  This was  entered with electrocautery.  The peritoneum was then entered bluntly  with a finger.  A balloon-tipped trocar was then inserted in the  abdomen.  The abdomen was then insufflated at 15 mmHg pressure.  Camera  was inserted.  There was no evidence of any entry injury.  Two further 5-  mm trocars were placed in the left side of the abdomen under direct  vision without complication after infiltration with local anesthetic.  He had some adhesions in his left lower quadrant that were lysed  sharply.  These were just fatty adhesions.  He also had  a fair amount of  adhesions to his falciform.  There was 1 loop of bowel that was up near  his abdominal wall that was taken down sharply.  The falciform was taken  down with some clips.  Hemostasis was observed.  All these areas were  also observed for enterotomies following the lysis of the adhesions, and  they were without any injuries.  The defect was then measured at about 8  x 10 cm.  It was more of a Swiss cheese defect.  I did elect to cover  his entire incision.  I measured this out, and this ended up being the  right about the size of a 20 x 25 cm piece of Proceed mesh.  I added  additional trocar in the right side of the abdomen that was placed under  direct vision without complication.  Following this, I then placed a  grasper through my 10-mm port and pulled the mesh in after this.  I  placed 4 sutures of 0 Prolene in the cardinal positions in that mesh  prior to doing this.  The mesh was unrolled.  The Endoclose device was  then used to secure the mesh in all 4 positions without difficulty.  The   mesh appeared to lie flat after this.  I then used the AbsorbaTack and  used 40 tacks to secure the edges of the mesh.  Following this, I then  bisected each of my prior sutures with an additional 0 Prolene suture  using the Storz suture passer.  This was done under direct vision  without any evidence of any injury.  The mesh appeared to be in good  position following this. Hemostasis was observed on the areas of  adhesions.  Again, there were no injuries noted.  I then removed my  balloon-tipped trocar, and I used a 0 Vicryl suture x2 to close the hole  using the Endoclose device.  Following this, the abdomen was then  desufflated.  All trocars were removed.  The incisions were closed with  4-0 Monocryl in a subcutaneous tissue fashion.  All these stab incisions  for the suture were closed with Dermabond as well as the other  incisions.  He tolerated this well.  His Foley catheter will remain in  overnight.  He had an abdominal binder placed upon completion.  He was  extubated in the operating and was transferred to the recovery room in  stable condition.      Juanetta Gosling, MD  Electronically Signed     MCW/MEDQ  D:  11/06/2008  T:  11/06/2008  Job:  [id number]   cc:   Janetta Hora. Fields, MD  [address], [place] [address]

## 2010-08-17 NOTE — Assessment & Plan Note (Signed)
OFFICE VISIT   OAKLEN, THIAM  DOB:  November 03, 1933                                       06/06/2007  QQVZD#:[id number]   The patient returns for followup today after repair of the abdominal  aortic aneurysm on February 9.  He has recovered well from this.  He is  having normal bowel movements and his appetite is returning.  He is also  returning to his normal physical activities.   He did have an episode of diarrhea recently.  There was no blood  involved in this and this resolved in a few days.  He thinks it was  viral in nature.  He is feeling well now.   PHYSICAL EXAM:  Blood pressure 104/63, heart rate 74 and regular.  Abdomen is soft and nontender.  There is no evidence of hernia.  Incision is well-healed.  He has 2+ femoral pulses.  He has a 2+ left  dorsalis pedis pulse and a 1+ right dorsalis pedis pulse.  Overall, the  patient seems to be recovering well.  He will follow up with me in 6  months' time.  If he is doing well at that time, we will space his  followup out further.   Janetta Hora. Fields, MD  Electronically Signed   CEF/MEDQ  D:  06/06/2007  T:  06/07/2007  Job:  [id number]   cc:   Chales Salmon. Abigail Miyamoto, M.D.

## 2010-08-20 NOTE — Op Note (Signed)
NAMESHREY, BOIKE                 ACCOUNT NO.:  [id number]   MEDICAL RECORD NO.:  [id number]          PATIENT TYPE:  AMB   LOCATION:  ENDO                         FACILITY:  [facility]   PHYSICIAN:  Petra Kuba, M.D.    DATE OF BIRTH:  04-Dec-1933   DATE OF PROCEDURE:  12/24/2003  DATE OF DISCHARGE:                                 OPERATIVE REPORT   PROCEDURE:  Colonoscopy.   INDICATION:  Screening.  Consent was signed after risks, benefits, methods,  options thoroughly discussed in the office.   MEDICINES USED:  1.  Demerol 50.  2.  Versed 6.   DESCRIPTION OF PROCEDURE:  Rectal inspection was pertinent for external  hemorrhoids, small.  The digital exam was negative.  Video pediatric  adjustable colonoscope was inserted and with some difficulty due to poor  prep, was able to be advanced to the cecum.  This did require some abdominal  pressure but no position changes.  Cecum was identified by the appendiceal  orifice and the ileocecal valve.  Left greater than right diverticula was  seen on insertion but no other obvious abnormality.  Scope was slowly  withdrawn.  There was formed stool in the cecum and right colon which could  not be washed and suctioned out and other parts of colon where we could not  wash and suction.  Overall, poor visualization was obtained but no obvious  mass lesions were seen.  Other than the diverticula, a tiny descending polyp  was seen and was cold biopsied x 2.  No other abnormalities were seen as we  slowly withdrew back to the rectum.  Anorectal pull-through and retroflexion  confirmed some small hemorrhoids.  The scope was straightened and readvanced  a short ways up the left side of the colon; air was suctioned, scope  removed.  The patient tolerated the procedure well.  There was no obvious  immediate complication.   ENDOSCOPIC DIAGNOSES:  1.  Internal/external hemorrhoids.  2.  Left greater than right diverticula.  3.  Tiny descending polyp seen  and cold biopsied.  4.  Otherwise, within normal limits to the cecum but with a poor prep, and      there could have been lesions missed.   PLAN:  I would like to repeat this in 1 year using a more aggressive prep  including mag citrate and GoLYTELY and then if normal and no other lesions,  might not need another screening before 10 years.  Happy to see back sooner  p.r.n..  1.  Otherwise, return care to Dr. Idell Pickles for the customary health care      maintenance to include yearly rectals and guaiacs.      MEM/MEDQ  D:  12/24/2003  T:  12/24/2003  Job:  [id number]   cc:   Dellis Anes. Idell Pickles, M.D.  [address]  [place]  [place] [address]  Fax: [phone]

## 2010-12-24 LAB — POCT I-STAT 3, ART BLOOD GAS (G3+)
Acid-Base Excess: 2
Bicarbonate: 22
O2 Saturation: 99
O2 Saturation: 99
Patient temperature: 36.7
TCO2: 23
TCO2: 23
pCO2 arterial: 22.3 — ABNORMAL LOW
pH, Arterial: 7.48 — ABNORMAL HIGH

## 2010-12-24 LAB — POCT I-STAT 7, (LYTES, BLD GAS, ICA,H+H)
Acid-Base Excess: 3 — ABNORMAL HIGH
Acid-base deficit: 2
Bicarbonate: 23.3
Calcium, Ion: 1.19
HCT: 28 — ABNORMAL LOW
HCT: 36 — ABNORMAL LOW
Hemoglobin: 9.5 — ABNORMAL LOW
O2 Saturation: 100
Operator id: 119851
Sodium: 133 — ABNORMAL LOW
Sodium: 135
TCO2: 24
pO2, Arterial: 385 — ABNORMAL HIGH

## 2010-12-24 LAB — CROSSMATCH

## 2010-12-24 LAB — CBC
HCT: 32.2 — ABNORMAL LOW
HCT: 32.6 — ABNORMAL LOW
Hemoglobin: 11 — ABNORMAL LOW
Hemoglobin: 11 — ABNORMAL LOW
Hemoglobin: 11.1 — ABNORMAL LOW
MCHC: 34.1
MCHC: 34.1
MCV: 93.3
Platelets: 157
Platelets: 195
Platelets: 219
RBC: 3.46 — ABNORMAL LOW
RBC: 3.47 — ABNORMAL LOW
RBC: 3.5 — ABNORMAL LOW
RBC: 4.24
RDW: 13.1
RDW: 13.6
WBC: 11.6 — ABNORMAL HIGH
WBC: 13.5 — ABNORMAL HIGH
WBC: 13.9 — ABNORMAL HIGH
WBC: 15.9 — ABNORMAL HIGH
WBC: 9.7

## 2010-12-24 LAB — CREATININE, SERUM: GFR calc non Af Amer: >60

## 2010-12-24 LAB — BASIC METABOLIC PANEL
BUN: 12
BUN: 12
BUN: 15
CO2: 26
CO2: 28
CO2: 31
Calcium: 7.9 — ABNORMAL LOW
Calcium: 8 — ABNORMAL LOW
Calcium: 8.1 — ABNORMAL LOW
Calcium: 9.5
Chloride: 105
Chloride: 98
Creatinine, Ser: 0.69
GFR calc Af Amer: >60
GFR calc Af Amer: >60
GFR calc non Af Amer: >60
GFR calc non Af Amer: >60
Glucose, Bld: 117 — ABNORMAL HIGH
Glucose, Bld: 127 — ABNORMAL HIGH
Glucose, Bld: 147 — ABNORMAL HIGH
Potassium: 3.7
Sodium: 136
Sodium: 136
Sodium: 138
Sodium: 140

## 2010-12-24 LAB — POCT I-STAT 4, (NA,K, GLUC, HGB,HCT)
HCT: 31 — ABNORMAL LOW
Hemoglobin: 10.5 — ABNORMAL LOW
Sodium: 134 — ABNORMAL LOW

## 2010-12-24 LAB — COMPREHENSIVE METABOLIC PANEL
ALT: 15
ALT: 24
AST: 19
AST: 21
Alkaline Phosphatase: 35 — ABNORMAL LOW
Alkaline Phosphatase: 53
CO2: 25
CO2: 25
Chloride: 95 — ABNORMAL LOW
Chloride: 99
GFR calc Af Amer: >60
GFR calc Af Amer: >60
GFR calc non Af Amer: >60
GFR calc non Af Amer: >60
Glucose, Bld: 203 — ABNORMAL HIGH
Potassium: 3.1 — ABNORMAL LOW
Sodium: 128 — ABNORMAL LOW
Sodium: 128 — ABNORMAL LOW
Total Bilirubin: 0.7
Total Bilirubin: 0.8

## 2010-12-24 LAB — BLOOD GAS, ARTERIAL
Acid-Base Excess: 1.5
Bicarbonate: 25.3 — ABNORMAL HIGH
Drawn by: 290241
pCO2 arterial: 38
pO2, Arterial: 76.1 — ABNORMAL LOW

## 2010-12-24 LAB — MAGNESIUM: Magnesium: 1.5

## 2010-12-24 LAB — PROTIME-INR
INR: 1.3
Prothrombin Time: 16.8 — ABNORMAL HIGH

## 2011-07-12 ENCOUNTER — Ambulatory Visit
Admission: RE | Admit: 2011-07-12 | Discharge: 2011-07-12 | Disposition: A | Discharge status: Home / Self Care | Payer: Federal, State, Local not specified - PPO | Source: Ambulatory Visit | Attending: Family Medicine | Admitting: Family Medicine

## 2011-07-12 ENCOUNTER — Other Ambulatory Visit: Payer: Self-pay | Admitting: Family Medicine

## 2011-07-12 DIAGNOSIS — M7989 Other specified soft tissue disorders: Secondary | ICD-10-CM

## 2015-03-13 ENCOUNTER — Emergency Department (HOSPITAL_COMMUNITY)
Admission: EM | Admit: 2015-03-13 | Discharge: 2015-03-13 | Disposition: A | Discharge status: Home / Self Care | Payer: Medicare Other | Attending: Emergency Medicine | Admitting: Emergency Medicine

## 2015-03-13 ENCOUNTER — Encounter (HOSPITAL_COMMUNITY): Payer: Self-pay | Admitting: *Deleted

## 2015-03-13 DIAGNOSIS — E871 Hypo-osmolality and hyponatremia: Secondary | ICD-10-CM | POA: Diagnosis not present

## 2015-03-13 DIAGNOSIS — I10 Essential (primary) hypertension: Secondary | ICD-10-CM | POA: Insufficient documentation

## 2015-03-13 DIAGNOSIS — R739 Hyperglycemia, unspecified: Secondary | ICD-10-CM

## 2015-03-13 DIAGNOSIS — E7800 Pure hypercholesterolemia, unspecified: Secondary | ICD-10-CM | POA: Diagnosis not present

## 2015-03-13 DIAGNOSIS — Z87891 Personal history of nicotine dependence: Secondary | ICD-10-CM | POA: Diagnosis not present

## 2015-03-13 DIAGNOSIS — E1165 Type 2 diabetes mellitus with hyperglycemia: Secondary | ICD-10-CM | POA: Diagnosis not present

## 2015-03-13 DIAGNOSIS — Z79899 Other long term (current) drug therapy: Secondary | ICD-10-CM | POA: Insufficient documentation

## 2015-03-13 DIAGNOSIS — E876 Hypokalemia: Secondary | ICD-10-CM

## 2015-03-13 DIAGNOSIS — M109 Gout, unspecified: Secondary | ICD-10-CM | POA: Diagnosis not present

## 2015-03-13 HISTORY — DX: Essential (primary) hypertension: I10

## 2015-03-13 HISTORY — DX: Pure hypercholesterolemia, unspecified: E78.00

## 2015-03-13 HISTORY — DX: Gout, unspecified: M10.9

## 2015-03-13 HISTORY — DX: Type 2 diabetes mellitus without complications: E11.9

## 2015-03-13 LAB — URINALYSIS, ROUTINE W REFLEX MICROSCOPIC
Bilirubin Urine: NEGATIVE
Glucose, UA: >1000 mg/dL — AB
HGB URINE DIPSTICK: NEGATIVE
Ketones, ur: NEGATIVE mg/dL
LEUKOCYTES UA: NEGATIVE
NITRITE: NEGATIVE
PROTEIN: NEGATIVE mg/dL
SPECIFIC GRAVITY, URINE: 1.022 (ref 1.005–1.030)
pH: 7 (ref 5.0–8.0)

## 2015-03-13 LAB — HEPATIC FUNCTION PANEL
ALBUMIN: 3.8 g/dL (ref 3.5–5.0)
ALT: 22 U/L (ref 17–63)
AST: 27 U/L (ref 15–41)
Alkaline Phosphatase: 84 U/L (ref 38–126)
BILIRUBIN TOTAL: 0.7 mg/dL (ref 0.3–1.2)
Bilirubin, Direct: 0.2 mg/dL (ref 0.1–0.5)
Indirect Bilirubin: 0.5 mg/dL (ref 0.3–0.9)
TOTAL PROTEIN: 6.1 g/dL — AB (ref 6.5–8.1)

## 2015-03-13 LAB — I-STAT CHEM 8, ED
BUN: 17 mg/dL (ref 6–20)
CHLORIDE: 97 mmol/L — AB (ref 101–111)
CREATININE: 0.7 mg/dL (ref 0.61–1.24)
Calcium, Ion: 1.2 mmol/L (ref 1.13–1.30)
GLUCOSE: 83 mg/dL (ref 65–99)
HCT: 38 % — ABNORMAL LOW (ref 39.0–52.0)
Hemoglobin: 12.9 g/dL — ABNORMAL LOW (ref 13.0–17.0)
POTASSIUM: 2.9 mmol/L — AB (ref 3.5–5.1)
Sodium: 137 mmol/L (ref 135–145)
TCO2: 26 mmol/L (ref 0–100)

## 2015-03-13 LAB — URINE MICROSCOPIC-ADD ON: Bacteria, UA: NONE SEEN

## 2015-03-13 LAB — CBC
HEMATOCRIT: 36.1 % — AB (ref 39.0–52.0)
HEMOGLOBIN: 12.2 g/dL — AB (ref 13.0–17.0)
MCH: 31.4 pg (ref 26.0–34.0)
MCHC: 33.8 g/dL (ref 30.0–36.0)
MCV: 93 fL (ref 78.0–100.0)
Platelets: 189 10*3/uL (ref 150–400)
RBC: 3.88 MIL/uL — AB (ref 4.22–5.81)
RDW: 12.3 % (ref 11.5–15.5)
WBC: 8.5 10*3/uL (ref 4.0–10.5)

## 2015-03-13 LAB — BASIC METABOLIC PANEL
Anion gap: 8 (ref 5–15)
BUN: 19 mg/dL (ref 6–20)
CHLORIDE: 93 mmol/L — AB (ref 101–111)
CO2: 26 mmol/L (ref 22–32)
Calcium: 9 mg/dL (ref 8.9–10.3)
Creatinine, Ser: 0.89 mg/dL (ref 0.61–1.24)
GFR calc non Af Amer: >60 mL/min (ref >60)
Glucose, Bld: 342 mg/dL — ABNORMAL HIGH (ref 65–99)
POTASSIUM: 3.8 mmol/L (ref 3.5–5.1)
SODIUM: 127 mmol/L — AB (ref 135–145)

## 2015-03-13 LAB — CBG MONITORING, ED
GLUCOSE-CAPILLARY: 33 mg/dL — AB (ref 65–99)
Glucose-Capillary: 316 mg/dL — ABNORMAL HIGH (ref 65–99)
Glucose-Capillary: 48 mg/dL — ABNORMAL LOW (ref 65–99)
Glucose-Capillary: 65 mg/dL (ref 65–99)

## 2015-03-13 MED ORDER — SODIUM CHLORIDE 0.9 % IV BOLUS (SEPSIS)
1000.0000 mL | Freq: Once | INTRAVENOUS | Status: AC
  Administered 2015-03-13: 1000 mL via INTRAVENOUS

## 2015-03-13 MED ORDER — POTASSIUM CHLORIDE CRYS ER 20 MEQ PO TBCR: 40.0000 meq | EXTENDED_RELEASE_TABLET | Freq: Every day | ORAL | Status: DC

## 2015-03-13 MED ORDER — POTASSIUM CHLORIDE CRYS ER 20 MEQ PO TBCR
40.0000 meq | EXTENDED_RELEASE_TABLET | Freq: Once | ORAL | Status: AC
  Administered 2015-03-13: 40 meq via ORAL
  Filled 2015-03-13: qty 2

## 2015-03-13 MED ORDER — INSULIN ASPART 100 UNIT/ML ~~LOC~~ SOLN
10.0000 [IU] | Freq: Once | SUBCUTANEOUS | Status: AC
  Administered 2015-03-13: 10 [IU] via SUBCUTANEOUS
  Filled 2015-03-13: qty 1

## 2015-03-13 NOTE — ED Notes (Signed)
Pt's sugar re-checked. Now 93. MD notified. Pt given 16 oz orange juice.

## 2015-03-13 NOTE — ED Notes (Signed)
Bed: [id number] Expected date:  Expected time:  Means of arrival:  Comments: tr8

## 2015-03-13 NOTE — ED Notes (Signed)
Pt reports he went to pcp yesterday and had blood work done. Pt was called today because blood work showed hyperglycemia and low sodium, pt was told to come to ED. Pt denies pain.

## 2015-03-13 NOTE — ED Provider Notes (Signed)
CSN: [id number]     Arrival date & time 03/13/15  1346 History   First MD Initiated Contact with Patient 03/13/15 1448     Chief Complaint  Patient presents with  . Low Sodium   . Hyperglycemia     (Consider location/radiation/quality/duration/timing/severity/associated sxs/prior Treatment) Patient is a 81 y.o. male presenting with hyperglycemia.  Hyperglycemia Blood sugar level PTA:  >300 Severity:  Mild Onset quality:  Gradual Duration:  5 days Timing:  Constant Chronicity:  New Diabetes status:  Controlled with diet and controlled with oral medications Current diabetic therapy:  Gilmepiride Context: recent change in diet   Relieved by:  None tried Ineffective treatments:  None tried Associated symptoms: no abdominal pain, no blurred vision, no chest pain, no dysuria, no fatigue, no fever, no increased thirst, no polyuria and no shortness of breath     Past Medical History  Diagnosis Date  . Diabetes mellitus without complication ([place])   . Hypertension   . High cholesterol   . Gout    Past Surgical History  Procedure Laterality Date  . Abdominal aortic aneurysm repair    . Hernia repair      abdominal    History reviewed. No pertinent family history. Social History  Substance Use Topics  . Smoking status: Former Research scientist (life sciences)  . Smokeless tobacco: None  . Alcohol Use: No    Review of Systems  Constitutional: Negative for fever, chills and fatigue.  HENT: Negative for congestion and drooling.   Eyes: Negative for blurred vision, photophobia and pain.  Respiratory: Negative for cough, shortness of breath and wheezing.   Cardiovascular: Negative for chest pain.  Gastrointestinal: Negative for abdominal pain.  Endocrine: Negative for polydipsia and polyuria.  Genitourinary: Negative for dysuria.  All other systems reviewed and are negative.     Allergies  Review of patient's allergies indicates no known allergies.  Home Medications   Prior to Admission  medications   Medication Sig Start Date End Date Taking? Authorizing Provider  allopurinol (ZYLOPRIM) 300 MG tablet Take 300 mg by mouth daily. 12/23/14  Yes Historical Provider, MD  glimepiride (AMARYL) 2 MG tablet Take 4 mg by mouth 2 (two) times daily. 01/22/15  Yes Historical Provider, MD  hydrochlorothiazide (HYDRODIURIL) 25 MG tablet Take 25 mg by mouth daily. 01/12/15  Yes Historical Provider, MD  JANUVIA 100 MG tablet Take 100 mg by mouth daily. 03/12/15  Yes Historical Provider, MD  lisinopril (PRINIVIL,ZESTRIL) 40 MG tablet Take 40 mg by mouth daily.   Yes Historical Provider, MD  pravastatin (PRAVACHOL) 80 MG tablet Take 80 mg by mouth daily. 12/23/14  Yes Historical Provider, MD  verapamil (CALAN-SR) 240 MG CR tablet Take 240 mg by mouth daily. 01/12/15  Yes Historical Provider, MD  ZETIA 10 MG tablet Take 10 mg by mouth daily. 02/16/15  Yes Historical Provider, MD  potassium chloride SA (K-DUR,KLOR-CON) 20 MEQ tablet Take 2 tablets (40 mEq total) by mouth daily. 03/13/15 03/20/15  Merrily Pew, MD   BP 98/64 mmHg  Pulse 47  Temp(Src) 98 F (36.7 C) (Oral)  Resp 16  SpO2 96% Physical Exam  Constitutional: He is oriented to person, place, and time. He appears well-developed and well-nourished.  HENT:  Head: Normocephalic and atraumatic.  Eyes: Conjunctivae are normal. Pupils are equal, round, and reactive to light.  Neck: Normal range of motion.  Cardiovascular: Normal rate and regular rhythm.   Pulmonary/Chest: Effort normal and breath sounds normal. No respiratory distress.  Abdominal: Soft. He exhibits no  distension. There is no tenderness.  Musculoskeletal: Normal range of motion. He exhibits no edema or tenderness.  Neurological: He is alert and oriented to person, place, and time.  No altered mental status, able to give full seemingly accurate history.  Face is symmetric, EOM's intact, pupils equal and reactive, vision intact, tongue and uvula midline without  deviation Upper and Lower extremity motor 5/5, intact pain perception in distal extremities, 2+ reflexes in biceps, patella and achilles tendons. Finger to nose normal, heel to shin normal. Walks without assistance or evident ataxia.   Skin: Skin is warm.  Nursing note and vitals reviewed.   ED Course  Procedures (including critical care time) Labs Review Labs Reviewed  BASIC METABOLIC PANEL - Abnormal; Notable for the following:    Sodium 127 (*)    Chloride 93 (*)    Glucose, Bld 342 (*)    All other components within normal limits  CBC - Abnormal; Notable for the following:    RBC 3.88 (*)    Hemoglobin 12.2 (*)    HCT 36.1 (*)    All other components within normal limits  URINALYSIS, ROUTINE W REFLEX MICROSCOPIC (NOT AT [facility]) - Abnormal; Notable for the following:    APPearance CLOUDY (*)    Glucose, UA >1000 (*)    All other components within normal limits  HEPATIC FUNCTION PANEL - Abnormal; Notable for the following:    Total Protein 6.1 (*)    All other components within normal limits  URINE MICROSCOPIC-ADD ON - Abnormal; Notable for the following:    Squamous Epithelial / LPF 0-5 (*)    All other components within normal limits  CBG MONITORING, ED - Abnormal; Notable for the following:    Glucose-Capillary 316 (*)    All other components within normal limits  CBG MONITORING, ED - Abnormal; Notable for the following:    Glucose-Capillary 33 (*)    All other components within normal limits  CBG MONITORING, ED - Abnormal; Notable for the following:    Glucose-Capillary 48 (*)    All other components within normal limits  I-STAT CHEM 8, ED - Abnormal; Notable for the following:    Potassium 2.9 (*)    Chloride 97 (*)    Hemoglobin 12.9 (*)    HCT 38.0 (*)    All other components within normal limits  CBG MONITORING, ED    Imaging Review No results found. I have personally reviewed and evaluated these images and lab results as part of my medical  decision-making.   EKG Interpretation None      MDM   Final diagnoses:  Hyperglycemia  Hyponatremia  Hypokalemia   81 year old male with diabetes controlled with oral medications presents to the emergency department with asymptomatic hyponatremia seen on routine labs by his PCP also with hyperglycemia. This corrected sodium was approximately 131 as is he had no symptoms I corrected his sodium and blood sugar in the emergency department. Patient with an episode of hypoglycemia quickly resolved with oral intake and then came back to be normal glycemic. Potassium around 2.9-3 likely from insulin and fluids we'll given supplementation for a week and have him follow-up with his doctor for repeat labs in approximately one week.     Merrily Pew, MD 03/13/15 [phone]

## 2015-03-13 NOTE — ED Notes (Signed)
Called to pt room.  Pt now sweating & flushed.  Feels blood sugar is low.

## 2015-03-13 NOTE — ED Notes (Signed)
Notified EDP,Messner,MD., and Lauren,A. Pt. i-stat Chem 8 results potassium 2.9.

## 2015-03-13 NOTE — ED Notes (Signed)
Dr. Dayna Barker informed of pt's hypoglycemic episode.  Per Dr. Dayna Barker, give o.j.

## 2015-03-13 NOTE — ED Notes (Signed)
Pt c/o of hypoglycemic symptoms. CBG was 33. MD informed. Pt given 8 oz orange juice and [place] sandwich w/ diet coke. Will continue to monitor sugars and symptoms.

## 2015-03-13 NOTE — ED Notes (Addendum)
Pt cannot use restroom at this time, aware urine specimen is needed. Urinal provided  

## 2015-03-18 ENCOUNTER — Other Ambulatory Visit: Payer: Self-pay | Admitting: Gastroenterology

## 2015-03-18 DIAGNOSIS — R1084 Generalized abdominal pain: Secondary | ICD-10-CM

## 2015-03-24 ENCOUNTER — Ambulatory Visit
Admission: RE | Admit: 2015-03-24 | Discharge: 2015-03-24 | Disposition: A | Discharge status: Home / Self Care | Payer: Medicare Other | Source: Ambulatory Visit | Attending: Gastroenterology | Admitting: Gastroenterology

## 2015-03-24 DIAGNOSIS — R1084 Generalized abdominal pain: Secondary | ICD-10-CM

## 2015-03-24 MED ORDER — IOPAMIDOL (ISOVUE-300) INJECTION 61%
100.0000 mL | Freq: Once | INTRAVENOUS | Status: AC | PRN
  Administered 2015-03-24: 100 mL via INTRAVENOUS

## 2015-03-26 ENCOUNTER — Telehealth: Payer: Self-pay | Admitting: Oncology

## 2015-03-26 ENCOUNTER — Telehealth: Payer: Self-pay | Admitting: *Deleted

## 2015-03-26 ENCOUNTER — Other Ambulatory Visit (HOSPITAL_COMMUNITY): Payer: Self-pay | Admitting: Gastroenterology

## 2015-03-26 DIAGNOSIS — R160 Hepatomegaly, not elsewhere classified: Secondary | ICD-10-CM

## 2015-03-26 NOTE — Telephone Encounter (Signed)
Oncology Nurse Navigator Documentation  Oncology Nurse Navigator Flowsheets 03/26/2015  Referral date to RadOnc/MedOnc 03/25/2015  Navigator Encounter Type Introductory phone call  Spoke with Mrs. Moncrief and provided new patient appointment for 04/10/15 at 1015/1030 in [facility] [place]. Informed of location of [place], valet service, and registration process. Reminded to bring insurance cards and a current medication list, including supplements. Wife verbalizes understanding.

## 2015-03-26 NOTE — Telephone Encounter (Signed)
Patient scheduled for [facility] 01/06 @ 10:30 w/Dr. Benay Spice

## 2015-04-03 ENCOUNTER — Other Ambulatory Visit: Payer: Self-pay | Admitting: Radiology

## 2015-04-07 ENCOUNTER — Ambulatory Visit (HOSPITAL_COMMUNITY)
Admission: RE | Admit: 2015-04-07 | Discharge: 2015-04-07 | Disposition: A | Discharge status: Home / Self Care | Payer: Medicare Other | Source: Ambulatory Visit | Attending: Gastroenterology | Admitting: Gastroenterology

## 2015-04-07 ENCOUNTER — Encounter (HOSPITAL_COMMUNITY): Payer: Self-pay

## 2015-04-07 DIAGNOSIS — N400 Enlarged prostate without lower urinary tract symptoms: Secondary | ICD-10-CM | POA: Diagnosis not present

## 2015-04-07 DIAGNOSIS — Z87891 Personal history of nicotine dependence: Secondary | ICD-10-CM | POA: Insufficient documentation

## 2015-04-07 DIAGNOSIS — E7800 Pure hypercholesterolemia, unspecified: Secondary | ICD-10-CM | POA: Diagnosis not present

## 2015-04-07 DIAGNOSIS — K869 Disease of pancreas, unspecified: Secondary | ICD-10-CM | POA: Diagnosis not present

## 2015-04-07 DIAGNOSIS — M109 Gout, unspecified: Secondary | ICD-10-CM | POA: Diagnosis not present

## 2015-04-07 DIAGNOSIS — I10 Essential (primary) hypertension: Secondary | ICD-10-CM | POA: Insufficient documentation

## 2015-04-07 DIAGNOSIS — E119 Type 2 diabetes mellitus without complications: Secondary | ICD-10-CM | POA: Insufficient documentation

## 2015-04-07 DIAGNOSIS — K4090 Unilateral inguinal hernia, without obstruction or gangrene, not specified as recurrent: Secondary | ICD-10-CM | POA: Insufficient documentation

## 2015-04-07 DIAGNOSIS — K7689 Other specified diseases of liver: Secondary | ICD-10-CM | POA: Insufficient documentation

## 2015-04-07 DIAGNOSIS — R160 Hepatomegaly, not elsewhere classified: Secondary | ICD-10-CM | POA: Insufficient documentation

## 2015-04-07 HISTORY — DX: Hepatomegaly, not elsewhere classified: R16.0

## 2015-04-07 LAB — PROTIME-INR
INR: 1.05 (ref 0.00–1.49)
Prothrombin Time: 13.9 seconds (ref 11.6–15.2)

## 2015-04-07 LAB — CBC
HCT: 37.8 % — ABNORMAL LOW (ref 39.0–52.0)
HEMOGLOBIN: 12.8 g/dL — AB (ref 13.0–17.0)
MCH: 32 pg (ref 26.0–34.0)
MCHC: 33.9 g/dL (ref 30.0–36.0)
MCV: 94.5 fL (ref 78.0–100.0)
PLATELETS: 200 10*3/uL (ref 150–400)
RBC: 4 MIL/uL — ABNORMAL LOW (ref 4.22–5.81)
RDW: 12.3 % (ref 11.5–15.5)
WBC: 11.6 10*3/uL — ABNORMAL HIGH (ref 4.0–10.5)

## 2015-04-07 LAB — GLUCOSE, CAPILLARY: GLUCOSE-CAPILLARY: 167 mg/dL — AB (ref 65–99)

## 2015-04-07 LAB — APTT: aPTT: 30 seconds (ref 24–37)

## 2015-04-07 MED ORDER — SODIUM CHLORIDE 0.9 % IV SOLN
INTRAVENOUS | Status: DC
  Administered 2015-04-07: 12:00:00 via INTRAVENOUS

## 2015-04-07 MED ORDER — FENTANYL CITRATE (PF) 100 MCG/2ML IJ SOLN
INTRAMUSCULAR | Status: AC
  Filled 2015-04-07: qty 4

## 2015-04-07 MED ORDER — FENTANYL CITRATE (PF) 100 MCG/2ML IJ SOLN
INTRAMUSCULAR | Status: AC | PRN
  Administered 2015-04-07: 50 ug via INTRAVENOUS

## 2015-04-07 MED ORDER — MIDAZOLAM HCL 2 MG/2ML IJ SOLN
INTRAMUSCULAR | Status: AC | PRN
  Administered 2015-04-07: 1 mg via INTRAVENOUS
  Administered 2015-04-07: 0.5 mg via INTRAVENOUS

## 2015-04-07 MED ORDER — MIDAZOLAM HCL 2 MG/2ML IJ SOLN
INTRAMUSCULAR | Status: AC
Start: 2015-04-07 — End: 2015-04-07
  Filled 2015-04-07: qty 6

## 2015-04-07 NOTE — H&P (Signed)
Chief Complaint: Patient was seen in consultation today for  US guided liver lesion biopsy Referring Physician(s): Magod,Marc  History of Present Illness: [name] is a 82 y.o. male with history of epigastric discomfort, weight loss, anemia, early satiety and recent imaging studies revealing pancreatic body mass with associated gastrohepatic adenopathy, and multifocal liver lesions. He presents today for image guided biopsy of liver lesion.  Past Medical History  Diagnosis Date  . Diabetes mellitus without complication ([place])   . Hypertension   . High cholesterol   . Gout   . Liver mass     Past Surgical History  Procedure Laterality Date  . Abdominal aortic aneurysm repair    . Hernia repair      abdominal     Allergies: Metformin and related  Medications: Prior to Admission medications   Medication Sig Start Date End Date Taking? Authorizing Provider  allopurinol (ZYLOPRIM) 300 MG tablet Take 300 mg by mouth daily. 12/23/14  Yes Historical Provider, MD  glimepiride (AMARYL) 2 MG tablet Take 4 mg by mouth 2 (two) times daily. 01/22/15  Yes Historical Provider, MD  hydrochlorothiazide (HYDRODIURIL) 25 MG tablet Take 25 mg by mouth daily. 01/12/15  Yes Historical Provider, MD  JANUVIA 100 MG tablet Take 100 mg by mouth daily. 03/12/15  Yes Historical Provider, MD  lisinopril (PRINIVIL,ZESTRIL) 40 MG tablet Take 40 mg by mouth daily.   Yes Historical Provider, MD  pravastatin (PRAVACHOL) 80 MG tablet Take 80 mg by mouth daily. 12/23/14  Yes Historical Provider, MD  verapamil (CALAN-SR) 240 MG CR tablet Take 240 mg by mouth daily. 01/12/15  Yes Historical Provider, MD  ZETIA 10 MG tablet Take 10 mg by mouth daily. 02/16/15  Yes Historical Provider, MD  potassium chloride SA (K-DUR,KLOR-CON) 20 MEQ tablet Take 2 tablets (40 mEq total) by mouth daily. 03/13/15 03/20/15  Merrily Pew, MD     History reviewed. No pertinent family history.  Social History   Social History   . Marital Status: Married    Spouse Name: N/A  . Number of Children: N/A  . Years of Education: N/A   Social History Main Topics  . Smoking status: Former Research scientist (life sciences)  . Smokeless tobacco: Former Systems developer    Quit date: 04/04/1968  . Alcohol Use: No  . Drug Use: No  . Sexual Activity: Not Asked   Other Topics Concern  . None   Social History Narrative      Review of Systems  Constitutional: Positive for appetite change and unexpected weight change. Negative for chills.  Respiratory: Negative for cough and shortness of breath.   Cardiovascular: Negative for chest pain.  Gastrointestinal: Positive for abdominal pain. Negative for nausea, vomiting and blood in stool.  Genitourinary: Negative for dysuria and hematuria.  Musculoskeletal: Negative for back pain.  Neurological: Negative for headaches.    Vital Signs: Blood pressure 137/65, temperature 98, heart rate 65, respirations 16, oxygen saturation is 100% room air   Physical Exam  Constitutional: He is oriented to person, place, and time. He appears well-developed and well-nourished.  Cardiovascular: Normal rate.   Occasional pauses noted  Pulmonary/Chest: Effort normal and breath sounds normal.  Abdominal: Soft. Bowel sounds are normal. There is no tenderness.  Musculoskeletal: Normal range of motion. He exhibits no edema.  Neurological: He is alert and oriented to person, place, and time.    Mallampati Score:     Imaging: Ct Abdomen Pelvis W Contrast  03/24/2015  CLINICAL DATA:  Upper  abdominal pain/bloating, postprandial discomfort, 20 pound weight loss, history of AAA repair EXAM: CT ABDOMEN AND PELVIS WITH CONTRAST TECHNIQUE: Multidetector CT imaging of the abdomen and pelvis was performed using the standard protocol following bolus administration of intravenous contrast. CONTRAST:  113mL ISOVUE-300 IOPAMIDOL (ISOVUE-300) INJECTION 61% COMPARISON:  CTA abdomen/pelvis dated 05/09/2007 FINDINGS: Lower chest:  Lung bases  are clear. Hepatobiliary: Multiple hepatic metastases in both lobes, approximately 10 in number. Dominant lesions include a 4.2 x 4.1 cm metastasis in segment 6 (series 3/ image 25) and a 4.0 x 3.1 cm metastasis in segment 4B (series 3/ image 22). Gallbladder is unremarkable. No intrahepatic or extrahepatic ductal dilatation. Pancreas: 3.0 x 5.3 cm hypoenhancing mass in the pancreatic body (series 3/ image 25), compatible with primary pancreatic adenocarcinoma. Secondary atrophy of the pancreatic tail with ductal dilatation. Mass narrows/involves the celiac axis (series 3/ image 22), the SMV (series 3/image 25, and the splenic vein. Associated venous collaterals. Spleen: Within normal limits. Adrenals/Urinary Tract: Adrenal glands are within normal limits. Bilateral kidneys are unremarkable.  No hydronephrosis. Bladder is within normal limits. Stomach/Bowel: Stomach is within normal limits. No evidence of bowel obstruction. Normal appendix (series 3/ image 53). Vascular/Lymphatic: Postsurgical changes related to prior infrarenal AAA repair. Atherosclerotic calcifications the abdominal aorta and branch vessels. Vascular involvement/occlusion with collateralization of the splenic vein and SMV, as above. Narrowing/involvement of the celiac axis, as above. 1.6 cm short axis node along the gastrohepatic ligament (series 3/image 15). Reproductive: Prostatomegaly with enlargement of the central gland which indents the base of the bladder. Other: No abdominopelvic ascites. Small fat containing right inguinal hernia. Tiny fat containing left inguinal hernia. Musculoskeletal: Degenerative changes of the visualized thoracolumbar spine. IMPRESSION: 3.0 x 5.3 cm hypoenhancing mass in the pancreatic body, compatible with primary pancreatic adenocarcinoma. Associated vascular involvement with collateralization, as above. 1.6 cm short axis gastrohepatic nodal metastasis. Multifocal hepatic metastases in both lobes, measuring up to  4.0 cm. These results will be called to the ordering clinician or representative by the Radiologist Assistant, and communication documented in the PACS or zVision Dashboard. Electronically Signed   By: Julian Hy M.D.   On: 03/24/2015 10:08    Labs:  CBC:  Recent Labs  03/13/15 1511 03/13/15 2022 04/07/15 1130  WBC 8.5  --  11.6*  HGB 12.2* 12.9* 12.8*  HCT 36.1* 38.0* 37.8*  PLT 189  --  200    COAGS:  Recent Labs  04/07/15 1130  INR 1.05  APTT 30    BMP:  Recent Labs  03/13/15 1511 03/13/15 2022  NA 127* 137  K 3.8 2.9*  CL 93* 97*  CO2 26  --   GLUCOSE 342* 83  BUN 19 17  CALCIUM 9.0  --   CREATININE 0.89 0.70  GFRNONAA >60  --   GFRAA >60  --     LIVER FUNCTION TESTS:  Recent Labs  03/13/15 1511  BILITOT 0.7  AST 27  ALT 22  ALKPHOS 84  PROT 6.1*  ALBUMIN 3.8    TUMOR MARKERS: No results for input(s): AFPTM, CEA, CA199, CHROMGRNA in the last 8760 hours.  Assessment and Plan: 82 y.o. male with history of epigastric discomfort, weight loss, anemia, early satiety and recent imaging studies revealing pancreatic body mass with associated gastrohepatic adenopathy, and multifocal liver lesions. He presents today for image guided biopsy of liver lesion. Risks and benefits discussed with the patient including, but not limited to bleeding, infection, damage to adjacent structures or low yield requiring  additional tests.All of the patient's questions were answered, patient is agreeable to proceed.Consent signed and in chart.      Thank you for this interesting consult.  I greatly enjoyed meeting [name] and look forward to participating in their care.  A copy of this report was sent to the requesting provider on this date.  Signed: D. Rowe Breyon 04/07/2015, 12:05 PM   I spent a total of 15 minutes in face to face in clinical consultation, greater than 50% of which was counseling/coordinating care for image guided liver lesion  biopsy

## 2015-04-07 NOTE — Procedures (Signed)
Technically successful US guided biopsy of indeterminate lesion/mass within the right lobe of the liver adjacent to the GB fossa.   No immediate complications.   Ronny Bacon, MD Pager #: [phone]

## 2015-04-07 NOTE — Discharge Instructions (Signed)
°Liver Biopsy °The liver is a large organ in the upper right-hand side of your abdomen. A liver biopsy is a procedure in which a tissue sample is taken from the liver and examined under a microscope. The procedure is done to confirm a suspected problem. °There are three types of liver biopsies: °· Percutaneous. In this type, an incision is made in your abdomen. The sample is removed through the incision with a needle. °· Laparoscopic. In this type, several incisions are made in the abdomen. A tiny camera is passed through one of the incisions to help guide the health care provider. The sample is removed through the other incision or incisions. °· Transjugular. In this type, an incision is made in the neck. A tube is passed through the incision to the liver. The sample is removed through the tube with a needle. °LET YOUR HEALTH CARE PROVIDER KNOW ABOUT: °· Any allergies you have. °· All medicines you are taking, including vitamins, herbs, eye drops, creams, and over-the-counter medicines. °· Previous problems you or members of your family have had with the use of anesthetics. °· Any blood disorders you have. °· Previous surgeries you have had. °· Medical conditions you have. °· Possibility of pregnancy, if this applies. °RISKS AND COMPLICATIONS °Generally, this is a safe procedure. However, problems can occur and include: °· Bleeding. °· Infection. °· Bruising. °· Collapsed lung. °· Leak of digestive juices (bile) from the liver or gallbladder. °· Problems with heart rhythm. °· Pain at the biopsy site or in the right shoulder. °· Low blood pressure (hypotension). °· Injury to nearby organs or tissues. °BEFORE THE PROCEDURE °· Your health care provider may do some blood or urine tests. These will help your health care provider learn how well your kidneys and liver are working and how well your blood clots. °· Ask your health care provider if you will be able to go home the day of the procedure. Arrange for someone to  take you home and stay with you for at least 24 hours. °· Do not eat or drink anything after midnight on the night before the procedure or as directed by your health care provider. °· Ask your health care provider about: °¨ Changing or stopping your regular medicines. This is especially important if you are taking diabetes medicines or blood thinners. °¨ Taking medicines such as aspirin and ibuprofen. These medicines can thin your blood. Do not take these medicines before your procedure if your health care provider asks you not to. °PROCEDURE °Regardless of the type of biopsy that will be done, you will have an IV line placed. Through this line, you will receive fluids and medicine to relax you. If you will be having a laparoscopic biopsy, you may also receive medicine through this line to make you sleep during the procedure (general anesthetic). °Percutaneous Liver Biopsy °· You will positioned on your back, with your right hand over your head. °· A health care provider will locate your liver by tapping and pressing on the right side of your abdomen or with the help of an ultrasound machine or CT scan. °· An area at the bottom of your last right rib will be numbed. °· An incision will be made in the numbed area. °· The biopsy needle will be inserted into the incision. °· Several samples of liver tissue will be taken with the biopsy needle. You will be asked to hold your breath as each sample is taken. °Laparoscopic Liver Biopsy °· You will be   positioned on your back. °· Several small incisions will be made in your abdomen. °· Your doctor will pass a tiny camera through one incision. The camera will allow the liver to be viewed on a TV monitor in the operating room. °· Tools will be passed through the other incision or incisions. These tools will be used to remove samples of liver tissue. °Transjugular Liver Biopsy °· You will be positioned on your back on an X-ray table, with your head turned to your left. °· An  area on your neck just over your jugular vein will be numbed. °· An incision will be made in the numbed area. °· A tiny tube will be inserted through the incision. It will be pushed through the jugular vein to a blood vessel in the liver called the hepatic vein. °· Dye will be inserted through the tube, and X-rays will be taken. The dye will make the blood vessels in the liver light up on the X-rays. °· The biopsy needle will be pushed through the tube until it reaches the liver. °· Samples of liver tissue will be taken with the biopsy needle. °· The needle and the tube will be removed. °After the samples are obtained, the incision or incisions will be closed. °AFTER THE PROCEDURE °· You will be taken to a recovery area. °· You may have to lie on your right side for 1-2 hours. This will prevent bleeding from the biopsy site. °· Your progress will be watched. Your blood pressure, pulse, and the biopsy site will be checked often. °· You may have some pain or feel sick. If this happens, tell your health care provider. °· As you begin to feel better, you will be offered ice and beverages. °· You may be allowed to go home when the medicines have worn off and you can walk, drink, eat, and use the bathroom. °  °This information is not intended to replace advice given to you by your health care provider. Make sure you discuss any questions you have with your health care provider. °  °Document Released: 06/11/2003 Document Revised: 04/11/2014 Document Reviewed: 05/17/2013 °Elsevier Interactive Patient Education ©2016 [employer] °Liver Biopsy, Care After °Refer to this sheet in the next few weeks. These instructions provide you with information on caring for yourself after your procedure. Your health care provider may also give you more specific instructions. Your treatment has been planned according to current medical practices, but problems sometimes occur. Call your health care provider if you have any problems or  questions after your procedure. °WHAT TO EXPECT AFTER THE PROCEDURE °After your procedure, it is typical to have the following: °· A small amount of discomfort in the area where the biopsy was done and in the right shoulder or shoulder blade. °· A small amount of bruising around the area where the biopsy was done and on the skin over the liver. °· Sleepiness and fatigue for the rest of the day. °HOME CARE INSTRUCTIONS  °· Rest at home for 1-2 days or as directed by your health care provider. °· Have a friend or family member stay with you for at least 24 hours. °· Because of the medicines used during the procedure, you should not do the following things in the first 24 hours: °· Drive. °· Use machinery. °· Be responsible for the care of other people. °· Sign legal documents. °· Take a bath or shower. °· There are many different ways to close and cover an incision, including stitches,   skin glue, and adhesive strips. Follow your health care provider's instructions on: °· Incision care. °· Bandage (dressing) changes and removal. °· Incision closure removal. °· Do not drink alcohol in the first week. °· Do not lift more than 5 pounds or play contact sports for 2 weeks after this test. °· Take medicines only as directed by your health care provider. Do not take medicine containing aspirin or non-steroidal anti-inflammatory medicines such as ibuprofen for 1 week after this test. °· It is your responsibility to get your test results. °SEEK MEDICAL CARE IF:  °· You have increased bleeding from an incision that results in more than a small spot of blood. °· You have redness, swelling, or increasing pain in any incisions. °· You notice a discharge or a bad smell coming from any of your incisions. °· You have a fever or chills. °SEEK IMMEDIATE MEDICAL CARE IF:  °· You develop swelling, bloating, or pain in your abdomen. °· You become dizzy or faint. °· You develop a rash. °· You are nauseous or vomit. °· You have difficulty  breathing, feel short of breath, or feel faint. °· You develop chest pain. °· You have problems with your speech or vision. °· You have trouble balancing or moving your arms or legs. °  °This information is not intended to replace advice given to you by your health care provider. Make sure you discuss any questions you have with your health care provider. °  °Document Released: 10/08/2004 Document Revised: 04/11/2014 Document Reviewed: 05/17/2013 °Elsevier Interactive Patient Education ©2016 [employer] °Moderate Conscious Sedation, Adult °Sedation is the use of medicines to promote relaxation and relieve discomfort and anxiety. Moderate conscious sedation is a type of sedation. Under moderate conscious sedation you are less alert than normal but are still able to respond to instructions or stimulation. Moderate conscious sedation is used during short medical and dental procedures. It is milder than deep sedation or general anesthesia and allows you to return to your regular activities sooner. °LET YOUR HEALTH CARE PROVIDER KNOW ABOUT:  °· Any allergies you have. °· All medicines you are taking, including vitamins, herbs, eye drops, creams, and over-the-counter medicines. °· Use of steroids (by mouth or creams). °· Previous problems you or members of your family have had with the use of anesthetics. °· Any blood disorders you have. °· Previous surgeries you have had. °· Medical conditions you have. °· Possibility of pregnancy, if this applies. °· Use of cigarettes, alcohol, or illegal drugs. °RISKS AND COMPLICATIONS °Generally, this is a safe procedure. However, as with any procedure, problems can occur. Possible problems include: °· Oversedation. °· Trouble breathing on your own. You may need to have a breathing tube until you are awake and breathing on your own. °· Allergic reaction to any of the medicines used for the procedure. °BEFORE THE PROCEDURE °· You may have blood tests done. These tests can help show  how well your kidneys and liver are working. They can also show how well your blood clots. °· A physical exam will be done.   °· Only take medicines as directed by your health care provider. You may need to stop taking medicines (such as blood thinners, aspirin, or nonsteroidal anti-inflammatory drugs) before the procedure.   °· Do not eat or drink at least 6 hours before the procedure or as directed by your health care provider. °· Arrange for a responsible adult, family member, or friend to take you home after the procedure. He or she should stay with   you for at least 24 hours after the procedure, until the medicine has worn off. °PROCEDURE  °· An intravenous (IV) catheter will be inserted into one of your veins. Medicine will be able to flow directly into your body through this catheter. You may be given medicine through this tube to help prevent pain and help you relax. °· The medical or dental procedure will be done. °AFTER THE PROCEDURE °· You will stay in a recovery area until the medicine has worn off. Your blood pressure and pulse will be checked.   °·  Depending on the procedure you had, you may be allowed to go home when you can tolerate liquids and your pain is under control. °  °This information is not intended to replace advice given to you by your health care provider. Make sure you discuss any questions you have with your health care provider. °  °Document Released: 12/14/2000 Document Revised: 04/11/2014 Document Reviewed: 11/26/2012 °Elsevier Interactive Patient Education ©2016 Elsevier Inc. ° °

## 2015-04-08 DIAGNOSIS — E7800 Pure hypercholesterolemia, unspecified: Secondary | ICD-10-CM | POA: Insufficient documentation

## 2015-04-08 DIAGNOSIS — E119 Type 2 diabetes mellitus without complications: Secondary | ICD-10-CM | POA: Insufficient documentation

## 2015-04-08 DIAGNOSIS — M109 Gout, unspecified: Secondary | ICD-10-CM | POA: Insufficient documentation

## 2015-04-08 DIAGNOSIS — I10 Essential (primary) hypertension: Secondary | ICD-10-CM | POA: Insufficient documentation

## 2015-04-10 ENCOUNTER — Telehealth: Payer: Self-pay | Admitting: Oncology

## 2015-04-10 ENCOUNTER — Encounter: Payer: Self-pay | Admitting: Oncology

## 2015-04-10 ENCOUNTER — Ambulatory Visit: Payer: Medicare Other | Admitting: Oncology

## 2015-04-10 ENCOUNTER — Encounter: Payer: Self-pay | Admitting: *Deleted

## 2015-04-10 ENCOUNTER — Encounter: Payer: Self-pay | Admitting: Physical Therapy

## 2015-04-10 ENCOUNTER — Ambulatory Visit (HOSPITAL_BASED_OUTPATIENT_CLINIC_OR_DEPARTMENT_OTHER): Payer: Medicare Other | Admitting: Oncology

## 2015-04-10 ENCOUNTER — Ambulatory Visit: Payer: Medicare Other | Attending: Oncology | Admitting: Physical Therapy

## 2015-04-10 ENCOUNTER — Ambulatory Visit: Payer: Medicare Other | Admitting: Nutrition

## 2015-04-10 VITALS — BP 114/55 | HR 133 | Temp 98.5°F | Resp 18 | Ht 72.0 in | Wt 155.5 lb

## 2015-04-10 DIAGNOSIS — E119 Type 2 diabetes mellitus without complications: Secondary | ICD-10-CM | POA: Diagnosis not present

## 2015-04-10 DIAGNOSIS — C787 Secondary malignant neoplasm of liver and intrahepatic bile duct: Secondary | ICD-10-CM | POA: Diagnosis not present

## 2015-04-10 DIAGNOSIS — C251 Malignant neoplasm of body of pancreas: Secondary | ICD-10-CM | POA: Diagnosis not present

## 2015-04-10 DIAGNOSIS — R531 Weakness: Secondary | ICD-10-CM | POA: Diagnosis not present

## 2015-04-10 MED ORDER — LIDOCAINE-PRILOCAINE 2.5-2.5 % EX CREA: TOPICAL_CREAM | CUTANEOUS | Status: DC

## 2015-04-10 MED ORDER — PROCHLORPERAZINE MALEATE 5 MG PO TABS: 5.0000 mg | ORAL_TABLET | Freq: Four times a day (QID) | ORAL | Status: DC | PRN

## 2015-04-10 NOTE — Therapy (Signed)
[facility] [address] [place], [place], [address] Phone: [phone]   Fax:  [phone]  Physical Therapy Evaluation  Patient Details  Name: [name] MRN: [id number] Date of Birth: 05/07/1933 Referring Provider: Dr. Betsy Coder  Encounter Date: 04/10/2015      PT End of Session - 04/10/15 1408    Visit Number 1   PT Start Time [id number]   PT Stop Time 1407   PT Time Calculation (min) 32 min   Activity Tolerance Patient tolerated treatment well   Behavior During Therapy [facility] for tasks assessed/performed      Past Medical History  Diagnosis Date  . Diabetes mellitus without complication ([place])   . Hypertension   . High cholesterol   . Gout   . Liver mass   . Pancreatic mass     Past Surgical History  Procedure Laterality Date  . Abdominal aortic aneurysm repair    . Hernia repair      abdominal     There were no vitals filed for this visit.  Visit Diagnosis:  Weakness - Plan: PT plan of care cert/re-cert      Subjective Assessment - 04/10/15 1332    Subjective Patient is attending GI clinic for Pancreas Cancer   Patient Stated Goals education   Currently in Pain? No/denies            [facility] PT Assessment - 04/10/15 0001    Assessment   Medical Diagnosis Pancreatic Cancer   Referring Provider Dr. Betsy Coder   Onset Date/Surgical Date 04/07/15   Prior Therapy None   Precautions   Precautions Other (comment)   Precaution Comments Cancer precautions   Balance Screen   Has the patient fallen in the past 6 months No   Has the patient had a decrease in activity level because of a fear of falling?  No   Prior Function   Level of Independence Independent   Vocation Retired   Leisure walks daily   Cognition   Overall Cognitive Status Within Functional Limits for tasks assessed   Observation/Other Assessments   Focus on Therapeutic Outcomes (FOTO)  no limitation   ROM / Strength    AROM / PROM / Strength AROM;Strength   AROM   Overall AROM Comments lumbar ROM is full   Strength   Overall Strength Comments bilateral hip strength is 4+/5                           PT Education - 04/10/15 1336    Education provided Yes   Education Details Walking program, tips to conserve energy,    Person(s) Educated Patient   Methods Explanation;Handout   Comprehension Verbalized understanding;Returned demonstration             PT Long Term Goals - 04/10/15 1337    PT LONG TERM GOAL #1   Title understand ways to conserve energy during treatment   Time 1   Period Days   Status Achieved   PT LONG TERM GOAL #2   Title understand ways to start a walking program   Time 1   Period Days   Status Achieved               Plan - 04/10/15 1409    Clinical Impression Statement Patient is a 82 year old male with diagnosis of Pancreatic Cancer on 04/07/2015 with a biopsy.  Patient is attending GI clinic.  Patient will be having chemotherapy.  Patient has full lumbar ROM.  Patient bilateral hip strength is 4+/5.  Patient  has to readjust himself when he stand up to get his balance.  Patient has not fallen in the last 6 months. Patient benefited physical therapy to discuss a walking program and how to conserve energy while having treatment.    Pt will benefit from skilled therapeutic intervention in order to improve on the following deficits Decreased strength   Rehab Potential Excellent   Clinical Impairments Affecting Rehab Potential None   PT Frequency 1x / week   PT Duration --  1 time visit in GI clinic   PT Treatment/Interventions Therapeutic activities;Therapeutic exercise   PT Next Visit Plan 1 time visit   PT Home Exercise Plan Current HEP   Recommended Other Services None   Consulted and Agree with Plan of Care Patient          G-Codes - May 07, 2015 1412    Functional Assessment Tool Used no limitation   Functional Limitation Other PT primary    Other PT Primary Current Status [id number]) 0 percent impaired, limited or restricted   Other PT Primary Goal Status [id number]) 0 percent impaired, limited or restricted   Other PT Primary Discharge Status [id number]) 0 percent impaired, limited or restricted       Problem List Patient Active Problem List   Diagnosis Date Noted  . Diabetes mellitus without complication ([place])   . Hypertension   . High cholesterol   . Gout   . Liver mass   . Pancreatic mass     Earlie Counts, PT 05/07/2015 2:14 PM   [facility] [address], [place], [place], [address] Phone: [phone]   Fax:  [phone]  Name: [name] MRN: [id number] Date of Birth: 05-07-1933

## 2015-04-10 NOTE — Progress Notes (Signed)
Patient was seen in GI clinic.  82 year old man diagnosed with metastatic pancreas cancer.  He is a patient of Dr. Julieanne Manson.  Past medical history includes diabetes, hypertension, hypercholesterolemia, gout, and liver mass.  Medications include Amaryl, Januvia, and Zetia.  Labs include glucose 167 on January 3.  Patient reports fasting blood sugars normally between 130 and 200.  Height: 6 feet 0 inches. Weight: 155.5 pounds. Usual body weight: 165-170 pounds. BMI: 21.08.  Patient reports he typically consumes 3 meals daily with large meals at breakfast and dinner. He sometimes consumes a protein bar between meals. He tries to follow a no concentrated sweet diet. Reports appetite and oral intake are within normal limits. Patient is positive for approximately 10 pound weight loss.  Nutrition diagnosis: Food and nutrition related knowledge deficit related to metastatic pancreas cancer and associated treatments as evidenced by no prior need for nutrition related information.  Intervention: Patient was educated to consume adequate calories and protein to promote weight maintenance and healing. Reviewed No concentrated sweets diet and encouraged patient to keep a log of his blood sugars for his primary doctor. Provided fact sheet on high-protein foods. Encouraged smaller more frequent meals and snacks. Questions were answered.  Teach back method was used.  Monitoring, evaluation, goals: Patient will tolerate adequate calories and protein to minimize weight loss throughout treatment and achieve adequate glycemic control.  Next visit: To be scheduled with treatment.  **Disclaimer: This note was dictated with voice recognition software. Similar sounding words can inadvertently be transcribed and this note may contain transcription errors which may not have been corrected upon publication of note.**

## 2015-04-10 NOTE — Progress Notes (Signed)
[facility] Psychosocial Distress Screening Clinical Social Work  Clinical Social Work met with pt, wife and older daughter at [facility] to review role of CSW, assess needs and review the distress screening protocol.  The patient scored a 0 on the Psychosocial Distress Thermometer which indicates no distress. Pt reports he has "had a good life and feels fine, this is a bump in the road". Clinical Social Worker attempted to delve deeper, but pt not open to discussion. Wife and daughter appeared interested in support and CSW reviewed options to assist caregivers and pts de-stress and ways to cope. CSW stressed to pt and family to reach out as needs arise or if they ever wanted to talk. Family and pt denied practical concerns and felt they had that currently covered.    ONCBCN DISTRESS SCREENING 04/10/2015  Screening Type Initial Screening  Distress experienced in past week (1-10) 0    Clinical Social Worker follow up needed: No.  If yes, follow up plan:  Loren Racer, [place]  [facility] Phone: [phone] Fax: [phone]

## 2015-04-10 NOTE — Patient Instructions (Signed)
     Ways to get started on an exercise program 1.  Start for 10 minutes per day with a walking program. 2. Work towards 30 minutes of exercise per day 3. When you do an aerobic exercise program start on a low level 4. Water aerobics is a good place due to decreased strain on your joints 5. Begin your exercise program gradually and progress slowly over time 6. When exercising use correct form. a. Keep neutral spine b. Engage abdominals c. Keep chest up  d. Chin down e. Do not lock your knees  Earlie Counts, PT [facility] [place], [place], [place] [address] [phone]   Tips for [employer] for Activities of Daily Living . Plan ahead to avoid rushing. . Sit down to bathe and dry off. Wear a terry robe instead of drying off. . Use a shower/bath organizer to decrease leaning and reaching. . Use extension handles on sponges and brushes. Susa Simmonds grab rails in the bathroom or use an elevated toilet seat. Hoyle Barr out clothes and toiletries before dressing. . Minimize leaning over to put on clothes and shoes. Bring your foot to your knee to apply socks and shoes. . Wear comfortable shoes and low-heeled, slip on shoes. Wear button front shirts rather than pullovers. Housekeeping . Schedule household tasks throughout the week. . Do housework sitting down when possible. . Delegate heavy housework, shopping, laundry and child care when possible. . Drag or slide objects rather than lifting. . Sit when ironing and take rest periods. . Stop working before becoming overly tired. Shopping . Organize list by aisle. . Use a grocery cart for support. Marland Kitchen Shop at less busy times. . Ask for help with getting to the car. Meal Preparation . Use convenience and easy-to-prepare foods. . Use small appliances that take less effort to use. Marland Kitchen Prepare meals sitting down. . Soak dishes instead of scrubbing and let dishes air dry. . Prepare double  portions and freeze half. Child Care . Plan activities that can be done sitting down, such as drawing pictures, playing games, reading, and computer games. . Encourage children to climb up onto your lap or into the highchair instead of being lifted. . Make a game of the household chores so that children will want to help. . Delegate child care when possible.  Earlie Counts, PT, [place] [place]; [address] [place], [place], [place] [id number]

## 2015-04-10 NOTE — Progress Notes (Signed)
[facility] Patient Consult   Referring MD: Clarene Essex, Md [phone] [address], [place] [address]   [name] 82 y.o.  08/03/1933    Reason for Referral: Pancreas cancer   HPI: Mr. Dethomas reports "indigestion and upper abdominal discomfort for the past few months. He has lost approximately 30 pounds over the past 6 months. He saw Dr. Rex Kras and was referred to Dr. Watt Climes.  He was referred for a CT of the abdomen and pelvis on 03/24/2015. The lung bases are clear. Multiple hepatic metastases were noted. No intrahepatic or extrahepatic ductal dilatation. A mass was noted in the pancreas body consistent with a primary tumor. The mass involved the celiac axis, superior mesenteric vein, and the splenic vein. Postsurgical changes from a prior infrarenal abdominal aortic aneurysm repair. There is a 1.6 cm gastrohepatic node. He was referred for an ultrasound-guided biopsy of a liver lesion on 04/07/2015. The pathology [id number]) revealed metastatic adenocarcinoma consistent with metastatic pancreas adenocarcinoma.    Past Medical History  Diagnosis Date  . Diabetes mellitus without complication ([place])   . Hypertension   . High cholesterol   . Gout   .  pancreas cancer-stage IV   January 2017   .      Past Surgical History  Procedure Laterality Date  . Abdominal aortic aneurysm repair    . Hernia repair      abdominal     Medications: Reviewed  Allergies:  Allergies  Allergen Reactions  . Metformin And Related     diarrhea    Family history: He is adopted. No family history of cancer.  Social History:   He is retired from the [employer]. He quit smoking cigarettes in 1970. No alcohol use. No transfusion history. No risk factor for HIV or hepatitis.  History  Alcohol Use No    History  Smoking status  . Former Smoker  Smokeless tobacco  . Former Systems developer  . Quit date: 04/04/1968      ROS:   Positives include: 30 pound  weight loss, "indigestion", elevated blood sugar  A complete ROS was otherwise negative.  Physical Exam:  Blood pressure 114/55, pulse 133, temperature 98.5 F (36.9 C), temperature source Oral, resp. rate 18, height 6' (1.829 m), weight 155 lb 8 oz (70.534 kg), SpO2 98 %.  HEENT: Upper denture plate, oropharynx without visible mass, neck without mass Lungs: Clear bilaterally Cardiac: Regular rate and rhythm Abdomen: No hepatosplenomegaly, no mass, midline hernia GU: Circumcised male, testes without mass  Vascular: No leg edema Lymph nodes: No cervical, supra-clavicular, axillary, or inguinal nodes Neurologic: Alert and oriented, the motor exam appears intact in the upper and lower extremities Skin: No rash Musculoskeletal: No spine tenderness   LAB:  CBC  Lab Results  Component Value Date   WBC 11.6* 04/07/2015   HGB 12.8* 04/07/2015   HCT 37.8* 04/07/2015   MCV 94.5 04/07/2015   PLT 200 04/07/2015   NEUTROABS 8.7* 11/12/2008     CMP      Component Value Date/Time   NA 137 03/13/2015 2022   K 2.9* 03/13/2015 2022   CL 97* 03/13/2015 2022   CO2 26 03/13/2015 1511   GLUCOSE 83 03/13/2015 2022   BUN 17 03/13/2015 2022   CREATININE 0.70 03/13/2015 2022   CALCIUM 9.0 03/13/2015 1511   PROT 6.1* 03/13/2015 1511   ALBUMIN 3.8 03/13/2015 1511   AST 27 03/13/2015 1511   ALT 22 03/13/2015 1511  ALKPHOS 84 03/13/2015 1511   BILITOT 0.7 03/13/2015 1511   GFRNONAA >60 03/13/2015 1511   GFRAA >60 03/13/2015 1511     Imaging:  As per history of present illness, I reviewed the 03/24/2015 with Mr. Demattos and his family   Assessment/Plan:   1. Metastatic pancreas cancer  CT abdomen/pelvis 03/24/2015 confirmed a pancreas body mass with involvement of the celiac axis,SMV, and splenic vein. Multiple liver metastases  Ultrasound-guided biopsy of a right liver lesion on 04/06/2014 confirmed metastatic adenocarcinoma  2.   Weight loss secondary to metastatic pancreas  cancer versus diabetes  3.   Diabetes   Disposition:   Mr. Dolson has been diagnosed with metastatic adenocarcinoma the pancreas. I discussed the diagnosis, prognosis, and treatment options with Mr. Stockstill and his family. He understands no therapy will be curative. We discussed supportive care versus a trial of systemic chemotherapy. We specifically reviewed single agent gemcitabine, gemcitabine/Abraxane, and FOLFIRINOX chemotherapy. He does not appear to be a good candidate for FOLFIRINOX.  Mr. Clemon indicated he would like to proceed with a trial of chemotherapy. We decided to begin gemcitabine/Abraxane on an every 2 week schedule with a first cycle scheduled for 04/20/2015.  Mr. Wampler will attend a chemotherapy teaching class. We will check a baseline CA 19-9. He will be referred to interventional radiology for placement of a Port-A-Cath.  I reviewed the potential toxicities associated with the gemcitabine/Abraxane regimen including the chance for nausea/vomiting, alopecia, and allergic reaction, and hematologic toxicity. We discussed the rash, fever, and pneumonitis associated with gemcitabine. We discussed the neuropathy seen with Abraxane. He agrees to proceed.  Mr. Letona will schedule an appointment with Dr. Rex Kras to evaluate the elevated blood sugars. This is likely in part related to the development of pancreas cancer.  Approximately 50 minutes were spent with the patient today. The majority of the time was used for counseling and coordination of care.    [place], [place] 04/10/2015, 2:18 PM

## 2015-04-10 NOTE — Progress Notes (Signed)
Oncology Nurse Navigator Documentation  Oncology Nurse Navigator Flowsheets 04/10/2015  Navigator Location [facility]  Navigator Encounter Type Clinic/MDC  Abnormal Finding Date 03/24/2015  Confirmed Diagnosis Date 04/07/2015  Patient Visit Type MedOnc  Treatment Phase Treatment  Barriers/Navigation Needs Education;Coordination of Care;Family concerns  Education Understanding Cancer/ Treatment Options;Coping with Diagnosis/ Prognosis;Newly Diagnosed Cancer Education;Preparing for Upcoming [facility] Treatment  Interventions Coordination of Care;Education Method  Education Method Verbal;Written;Teach-back  Support Groups/Services GI Support Group;ACS for Set designer  Acuity Level 1  Time Spent with Patient 70  Met with patient, wife and a daughter during new patient visit. Explained the role of the GI Nurse Navigator and provided New Patient Packet with information on: 1. Pancreas cancer; provided info on Abraxane and Gemzar at family request 2. Support groups 3. Advanced Directives 4. Fall Safety Plan Answered questions, reviewed current treatment plan using TEACH back and provided emotional support. Provided copy of current treatment plan. Seen in [facility] today by CSW, dietician and physical therapy.  Merceda Elks, RN, BSN GI Oncology [place]

## 2015-04-10 NOTE — Telephone Encounter (Signed)
per pof to sch pt appt-sent MW email to sch trmt-pt to get updated copy of sch @ chemo class 1/11

## 2015-04-13 ENCOUNTER — Telehealth: Payer: Self-pay | Admitting: *Deleted

## 2015-04-13 NOTE — Telephone Encounter (Signed)
Per staff message and POF I have scheduled appts. Advised scheduler of appts. JMW  

## 2015-04-14 ENCOUNTER — Telehealth: Payer: Self-pay | Admitting: *Deleted

## 2015-04-14 NOTE — Telephone Encounter (Signed)
Pt's wife left message on navigator's voicemail reporting he had a floating stool. Wonders if he needs to begin pancreatic enzyme. Returned call, she reports he clarified he has NOT had floating stools. Asks when they should be concerned. Instructed her to call office if he begins having several stools/ day or a stool after each meal. She is concerned because his weight is down 4 lbs. He is eating several meals/ day. Discussed introducing Glucerna between meals for supplementation. Reviewed appropriate phone # for symptom management calls, wife understands to call [phone] for triage.

## 2015-04-15 ENCOUNTER — Other Ambulatory Visit: Payer: Self-pay | Admitting: Radiology

## 2015-04-15 ENCOUNTER — Encounter: Payer: Self-pay | Admitting: *Deleted

## 2015-04-15 ENCOUNTER — Other Ambulatory Visit (HOSPITAL_BASED_OUTPATIENT_CLINIC_OR_DEPARTMENT_OTHER): Payer: Medicare Other

## 2015-04-15 ENCOUNTER — Other Ambulatory Visit: Payer: Medicare Other

## 2015-04-15 DIAGNOSIS — C251 Malignant neoplasm of body of pancreas: Secondary | ICD-10-CM

## 2015-04-15 LAB — CBC WITH DIFFERENTIAL/PLATELET
BASO%: 0.6 % (ref 0.0–2.0)
Basophils Absolute: 0.1 10*3/uL (ref 0.0–0.1)
EOS%: 1.3 % (ref 0.0–7.0)
Eosinophils Absolute: 0.1 10*3/uL (ref 0.0–0.5)
HCT: 37 % — ABNORMAL LOW (ref 38.4–49.9)
HGB: 12.5 g/dL — ABNORMAL LOW (ref 13.0–17.1)
LYMPH%: 10.1 % — AB (ref 14.0–49.0)
MCH: 31.7 pg (ref 27.2–33.4)
MCHC: 33.8 g/dL (ref 32.0–36.0)
MCV: 93.9 fL (ref 79.3–98.0)
MONO#: 0.9 10*3/uL (ref 0.1–0.9)
MONO%: 8.6 % (ref 0.0–14.0)
NEUT#: 8.1 10*3/uL — ABNORMAL HIGH (ref 1.5–6.5)
NEUT%: 79.4 % — AB (ref 39.0–75.0)
Platelets: 186 10*3/uL (ref 140–400)
RBC: 3.94 10*6/uL — AB (ref 4.20–5.82)
RDW: 12.5 % (ref 11.0–14.6)
WBC: 10.2 10*3/uL (ref 4.0–10.3)
lymph#: 1 10*3/uL (ref 0.9–3.3)

## 2015-04-15 LAB — COMPREHENSIVE METABOLIC PANEL
ALK PHOS: 109 U/L (ref 40–150)
ALT: 31 U/L (ref 0–55)
ANION GAP: 9 meq/L (ref 3–11)
AST: 26 U/L (ref 5–34)
Albumin: 3.7 g/dL (ref 3.5–5.0)
BUN: 23.9 mg/dL (ref 7.0–26.0)
CHLORIDE: 96 meq/L — AB (ref 98–109)
CO2: 26 mEq/L (ref 22–29)
Calcium: 9.4 mg/dL (ref 8.4–10.4)
Creatinine: 1.2 mg/dL (ref 0.7–1.3)
EGFR: 58 mL/min/{1.73_m2} — ABNORMAL LOW (ref >90)
Glucose: 408 mg/dl — ABNORMAL HIGH (ref 70–140)
Potassium: 3.9 mEq/L (ref 3.5–5.1)
Sodium: 132 mEq/L — ABNORMAL LOW (ref 136–145)
Total Bilirubin: 0.68 mg/dL (ref 0.20–1.20)
Total Protein: 6.6 g/dL (ref 6.4–8.3)

## 2015-04-16 ENCOUNTER — Other Ambulatory Visit: Payer: Self-pay | Admitting: Radiology

## 2015-04-16 ENCOUNTER — Ambulatory Visit: Payer: Federal, State, Local not specified - PPO | Admitting: Cardiology

## 2015-04-16 ENCOUNTER — Telehealth: Payer: Self-pay | Admitting: *Deleted

## 2015-04-16 LAB — CANCER ANTIGEN 19-9

## 2015-04-16 NOTE — Telephone Encounter (Signed)
-----   Message from Ladell Pier, MD sent at 04/15/2015  7:31 PM EST ----- Please call patient, glucose is high, needs to f/u with primary md to manage diabetes, needs to check blood sugar after chemo. And call for greater than 400

## 2015-04-16 NOTE — Telephone Encounter (Signed)
Per Dr. Benay Spice; notified pt's wife that glucose was high yesterday 408 (non-fasting) and pt needs to f/u with PCP to manage diabetes.  Wife verbalized understanding and states "his sugar this morning fasting is 131; his primary did put him on a different pill"  Explained to wife that its very important to check blood sugar after chemo; will be getting steroids that will elevate blood sugar and to call if greater than 400.  Wife verbalized understanding and expressed appreciation for call.

## 2015-04-17 ENCOUNTER — Ambulatory Visit (HOSPITAL_COMMUNITY)
Admission: RE | Admit: 2015-04-17 | Discharge: 2015-04-17 | Disposition: A | Discharge status: Home / Self Care | Payer: Medicare Other | Source: Ambulatory Visit | Attending: Oncology | Admitting: Oncology

## 2015-04-17 ENCOUNTER — Encounter (HOSPITAL_COMMUNITY): Payer: Self-pay

## 2015-04-17 ENCOUNTER — Other Ambulatory Visit: Payer: Self-pay | Admitting: Oncology

## 2015-04-17 DIAGNOSIS — C250 Malignant neoplasm of head of pancreas: Secondary | ICD-10-CM | POA: Insufficient documentation

## 2015-04-17 DIAGNOSIS — C251 Malignant neoplasm of body of pancreas: Secondary | ICD-10-CM

## 2015-04-17 LAB — BASIC METABOLIC PANEL
Anion gap: 15 (ref 5–15)
BUN: 23 mg/dL — ABNORMAL HIGH (ref 6–20)
CHLORIDE: 99 mmol/L — AB (ref 101–111)
CO2: 20 mmol/L — ABNORMAL LOW (ref 22–32)
CREATININE: 0.7 mg/dL (ref 0.61–1.24)
Calcium: 9.3 mg/dL (ref 8.9–10.3)
GFR calc non Af Amer: >60 mL/min (ref >60)
Glucose, Bld: 132 mg/dL — ABNORMAL HIGH (ref 65–99)
POTASSIUM: 3.4 mmol/L — AB (ref 3.5–5.1)
SODIUM: 134 mmol/L — AB (ref 135–145)

## 2015-04-17 LAB — GLUCOSE, CAPILLARY: Glucose-Capillary: 120 mg/dL — ABNORMAL HIGH (ref 65–99)

## 2015-04-17 LAB — CBC
HCT: 35.2 % — ABNORMAL LOW (ref 39.0–52.0)
Hemoglobin: 12 g/dL — ABNORMAL LOW (ref 13.0–17.0)
MCH: 31.7 pg (ref 26.0–34.0)
MCHC: 34.1 g/dL (ref 30.0–36.0)
MCV: 93.1 fL (ref 78.0–100.0)
PLATELETS: 178 10*3/uL (ref 150–400)
RBC: 3.78 MIL/uL — AB (ref 4.22–5.81)
RDW: 12.3 % (ref 11.5–15.5)
WBC: 8.7 10*3/uL (ref 4.0–10.5)

## 2015-04-17 LAB — PROTIME-INR
INR: 1.06 (ref 0.00–1.49)
Prothrombin Time: 14 seconds (ref 11.6–15.2)

## 2015-04-17 LAB — APTT: aPTT: 28 seconds (ref 24–37)

## 2015-04-17 MED ORDER — HEPARIN SOD (PORK) LOCK FLUSH 100 UNIT/ML IV SOLN
INTRAVENOUS | Status: AC | PRN
  Administered 2015-04-17: 500 [IU]

## 2015-04-17 MED ORDER — HEPARIN SOD (PORK) LOCK FLUSH 100 UNIT/ML IV SOLN
INTRAVENOUS | Status: AC
  Filled 2015-04-17: qty 5

## 2015-04-17 MED ORDER — MIDAZOLAM HCL 2 MG/2ML IJ SOLN
INTRAMUSCULAR | Status: AC | PRN
  Administered 2015-04-17: 1 mg via INTRAVENOUS

## 2015-04-17 MED ORDER — LIDOCAINE-EPINEPHRINE 2 %-1:100000 IJ SOLN
INTRAMUSCULAR | Status: AC
Start: 2015-04-17 — End: 2015-04-17
  Filled 2015-04-17: qty 1

## 2015-04-17 MED ORDER — SODIUM CHLORIDE 0.9 % IV SOLN
Freq: Once | INTRAVENOUS | Status: AC
  Administered 2015-04-17: 09:00:00 via INTRAVENOUS

## 2015-04-17 MED ORDER — FENTANYL CITRATE (PF) 100 MCG/2ML IJ SOLN
INTRAMUSCULAR | Status: AC
  Filled 2015-04-17: qty 2

## 2015-04-17 MED ORDER — CEFAZOLIN SODIUM-DEXTROSE 2-3 GM-% IV SOLR
INTRAVENOUS | Status: AC
  Filled 2015-04-17: qty 50

## 2015-04-17 MED ORDER — LIDOCAINE HCL 1 % IJ SOLN
INTRAMUSCULAR | Status: AC
  Filled 2015-04-17: qty 20

## 2015-04-17 MED ORDER — CEFAZOLIN SODIUM-DEXTROSE 2-3 GM-% IV SOLR
2.0000 g | INTRAVENOUS | Status: AC
  Administered 2015-04-17: 2 g via INTRAVENOUS

## 2015-04-17 MED ORDER — MIDAZOLAM HCL 2 MG/2ML IJ SOLN
INTRAMUSCULAR | Status: AC
Start: 2015-04-17 — End: 2015-04-17
  Filled 2015-04-17: qty 4

## 2015-04-17 MED ORDER — FENTANYL CITRATE (PF) 100 MCG/2ML IJ SOLN
INTRAMUSCULAR | Status: AC | PRN
  Administered 2015-04-17: 25 ug via INTRAVENOUS

## 2015-04-17 NOTE — Procedures (Signed)
Successful RT IJ POWER PORT TIP SVC/RA NO COMP STABLE EBL 0 READY FOR USE  

## 2015-04-17 NOTE — Discharge Instructions (Signed)
Implanted Port Insertion, Care After °Refer to this sheet in the next few weeks. These instructions provide you with information on caring for yourself after your procedure. Your health care provider may also give you more specific instructions. Your treatment has been planned according to current medical practices, but problems sometimes occur. Call your health care provider if you have any problems or questions after your procedure. °WHAT TO EXPECT AFTER THE PROCEDURE °After your procedure, it is typical to have the following:  °· Discomfort at the port insertion site. Ice packs to the area will help. °· Bruising on the skin over the port. This will subside in 3-4 days. °HOME CARE INSTRUCTIONS °· After your port is placed, you will get a manufacturer's information card. The card has information about your port. Keep this card with you at all times.   °· Know what kind of port you have. There are many types of ports available.   °· Wear a medical alert bracelet in case of an emergency. This can help alert health care workers that you have a port.   °· The port can stay in for as long as your health care provider believes it is necessary.   °· A home health care nurse may give medicines and take care of the port.   °· You or a family member can get special training and directions for giving medicine and taking care of the port at home.   °SEEK MEDICAL CARE IF:  °· Your port does not flush or you are unable to get a blood return.   °· You have a fever or chills. °SEEK IMMEDIATE MEDICAL CARE IF: °· You have new fluid or pus coming from your incision.   °· You notice a bad smell coming from your incision site.   °· You have swelling, pain, or more redness at the incision or port site.   °· You have chest pain or shortness of breath. °  °This information is not intended to replace advice given to you by your health care provider. Make sure you discuss any questions you have with your health care provider. °  °Document  Released: 01/09/2013 Document Revised: 03/26/2013 Document Reviewed: 01/09/2013 °Elsevier Interactive Patient Education ©2016 Elsevier Inc. °Implanted Port Home Guide °An implanted port is a type of central line that is placed under the skin. Central lines are used to provide IV access when treatment or nutrition needs to be given through a person's veins. Implanted ports are used for long-term IV access. An implanted port may be placed because:  °· You need IV medicine that would be irritating to the small veins in your hands or arms.   °· You need long-term IV medicines, such as antibiotics.   °· You need IV nutrition for a long period.   °· You need frequent blood draws for lab tests.   °· You need dialysis.   °Implanted ports are usually placed in the chest area, but they can also be placed in the upper arm, the abdomen, or the leg. An implanted port has two main parts:  °· Reservoir. The reservoir is round and will appear as a small, raised area under your skin. The reservoir is the part where a needle is inserted to give medicines or draw blood.   °· Catheter. The catheter is a thin, flexible tube that extends from the reservoir. The catheter is placed into a large vein. Medicine that is inserted into the reservoir goes into the catheter and then into the vein.   °HOW WILL I CARE FOR MY INCISION SITE? °Do not get the   incision site wet. Bathe or shower as directed by your health care provider.  °HOW IS MY PORT ACCESSED? °Special steps must be taken to access the port:  °· Before the port is accessed, a numbing cream can be placed on the skin. This helps numb the skin over the port site.   °· Your health care provider uses a sterile technique to access the port. °· Your health care provider must put on a mask and sterile gloves. °· The skin over your port is cleaned carefully with an antiseptic and allowed to dry. °· The port is gently pinched between sterile gloves, and a needle is inserted into the  port. °· Only "non-coring" port needles should be used to access the port. Once the port is accessed, a blood return should be checked. This helps ensure that the port is in the vein and is not clogged.   °· If your port needs to remain accessed for a constant infusion, a clear (transparent) bandage will be placed over the needle site. The bandage and needle will need to be changed every week, or as directed by your health care provider.   °· Keep the bandage covering the needle clean and dry. Do not get it wet. Follow your health care provider's instructions on how to take a shower or bath while the port is accessed.   °· If your port does not need to stay accessed, no bandage is needed over the port.   °WHAT IS FLUSHING? °Flushing helps keep the port from getting clogged. Follow your health care provider's instructions on how and when to flush the port. Ports are usually flushed with saline solution or a medicine called heparin. The need for flushing will depend on how the port is used.  °· If the port is used for intermittent medicines or blood draws, the port will need to be flushed:   °· After medicines have been given.   °· After blood has been drawn.   °· As part of routine maintenance.   °· If a constant infusion is running, the port may not need to be flushed.   °HOW LONG WILL MY PORT STAY IMPLANTED? °The port can stay in for as long as your health care provider thinks it is needed. When it is time for the port to come out, surgery will be done to remove it. The procedure is similar to the one performed when the port was put in.  °WHEN SHOULD I SEEK IMMEDIATE MEDICAL CARE? °When you have an implanted port, you should seek immediate medical care if:  °· You notice a bad smell coming from the incision site.   °· You have swelling, redness, or drainage at the incision site.   °· You have more swelling or pain at the port site or the surrounding area.   °· You have a fever that is not controlled with  medicine. °  °This information is not intended to replace advice given to you by your health care provider. Make sure you discuss any questions you have with your health care provider. °  °Document Released: 03/21/2005 Document Revised: 01/09/2013 Document Reviewed: 11/26/2012 °Elsevier Interactive Patient Education ©2016 Elsevier Inc. ° ° °Moderate Conscious Sedation, Adult, Care After °Refer to this sheet in the next few weeks. These instructions provide you with information on caring for yourself after your procedure. Your health care provider may also give you more specific instructions. Your treatment has been planned according to current medical practices, but problems sometimes occur. Call your health care provider if you have any problems or questions   after your procedure. °WHAT TO EXPECT AFTER THE PROCEDURE  °After your procedure: °· You may feel sleepy, clumsy, and have poor balance for several hours. °· Vomiting may occur if you eat too soon after the procedure. °HOME CARE INSTRUCTIONS °· Do not participate in any activities where you could become injured for at least 24 hours. Do not: °¨ Drive. °¨ Swim. °¨ Ride a bicycle. °¨ Operate heavy machinery. °¨ Cook. °¨ Use power tools. °¨ Climb ladders. °¨ Work from a high place. °· Do not make important decisions or sign legal documents until you are improved. °· If you vomit, drink water, juice, or soup when you can drink without vomiting. Make sure you have little or no nausea before eating solid foods. °· Only take over-the-counter or prescription medicines for pain, discomfort, or fever as directed by your health care provider. °· Make sure you and your family fully understand everything about the medicines given to you, including what side effects may occur. °· You should not drink alcohol, take sleeping pills, or take medicines that cause drowsiness for at least 24 hours. °· If you smoke, do not smoke without supervision. °· If you are feeling better, you  may resume normal activities 24 hours after you were sedated. °· Keep all appointments with your health care provider. °SEEK MEDICAL CARE IF: °· Your skin is pale or bluish in color. °· You continue to feel nauseous or vomit. °· Your pain is getting worse and is not helped by medicine. °· You have bleeding or swelling. °· You are still sleepy or feeling clumsy after 24 hours. °SEEK IMMEDIATE MEDICAL CARE IF: °· You develop a rash. °· You have difficulty breathing. °· You develop any type of allergic problem. °· You have a fever. °MAKE SURE YOU: °· Understand these instructions. °· Will watch your condition. °· Will get help right away if you are not doing well or get worse. °  °This information is not intended to replace advice given to you by your health care provider. Make sure you discuss any questions you have with your health care provider. °  °Document Released: 01/09/2013 Document Revised: 04/11/2014 Document Reviewed: 01/09/2013 °Elsevier Interactive Patient Education ©2016 Elsevier Inc. ° °

## 2015-04-17 NOTE — Progress Notes (Signed)
Patient ID: [name], male   DOB: 07/12/1933, 82 y.o.   MRN: [id number]    Referring Physician(s): Betsy Coder B  Chief Complaint:  Metastatic pancreatic cancer  Subjective: Patient familiar  to IR service from recent liver lesion biopsy on 04/10/15. He has known metastatic pancreatic cancer and presents today for Port-A-Cath placement for chemotherapy. He currently denies fever, chills, headache, chest pain, dyspnea, cough, back pain, nausea, vomiting or abnormal bleeding. He does  have history of weight loss, and occasional abdominal discomfort.   Allergies: Metformin and related  Medications: Prior to Admission medications   Medication Sig Start Date End Date Taking? Authorizing Provider  allopurinol (ZYLOPRIM) 300 MG tablet Take 300 mg by mouth daily. 12/23/14  Yes Historical Provider, MD  canagliflozin (INVOKANA) 100 MG TABS tablet Take 100 mg by mouth daily before breakfast.   Yes Historical Provider, MD  glimepiride (AMARYL) 2 MG tablet Take 4 mg by mouth 2 (two) times daily. 01/22/15  Yes Historical Provider, MD  hydrochlorothiazide (HYDRODIURIL) 25 MG tablet Take 25 mg by mouth daily. 01/12/15  Yes Historical Provider, MD  lisinopril (PRINIVIL,ZESTRIL) 40 MG tablet Take 40 mg by mouth daily.   Yes Historical Provider, MD  pravastatin (PRAVACHOL) 80 MG tablet Take 80 mg by mouth daily. 12/23/14  Yes Historical Provider, MD  verapamil (CALAN-SR) 240 MG CR tablet Take 240 mg by mouth daily. 01/12/15  Yes Historical Provider, MD  ZETIA 10 MG tablet Take 10 mg by mouth daily. 02/16/15  Yes Historical Provider, MD  lidocaine-prilocaine (EMLA) cream Apply to port one hour prior to use. Do not rub in. Cover with plastic. 04/10/15   Ladell Pier, MD  potassium chloride SA (K-DUR,KLOR-CON) 20 MEQ tablet Take 2 tablets (40 mEq total) by mouth daily. 03/13/15 03/20/15  Merrily Pew, MD  prochlorperazine (COMPAZINE) 5 MG tablet Take 1 tablet (5 mg total) by mouth every 6 (six) hours as  needed for nausea or vomiting. 04/10/15   Ladell Pier, MD     Vital Signs: BP 140/77 mmHg  Pulse 68  Temp(Src) 97.3 F (36.3 C) (Oral)  Resp 16  Ht 6' (1.829 m)  Wt 150 lb (68.04 kg)  BMI 20.34 kg/m2  SpO2 98%  Physical Exam patient awake /alert, chest with clear breath sounds bilaterally. Heart with regular rate and rhythm; abdomen soft, positive bowel sounds, nontender. Extremities with no edema.  Imaging: No results found.  Labs:  CBC:  Recent Labs  03/13/15 1511 03/13/15 2022 04/07/15 1130 04/15/15 0914 04/17/15 0820  WBC 8.5  --  11.6* 10.2 8.7  HGB 12.2* 12.9* 12.8* 12.5* 12.0*  HCT 36.1* 38.0* 37.8* 37.0* 35.2*  PLT 189  --  200 186 178    COAGS:  Recent Labs  04/07/15 1130 04/17/15 0820  INR 1.05 1.06  APTT 30 28    BMP:  Recent Labs  03/13/15 1511 03/13/15 2022 04/15/15 0915 04/17/15 0820  NA 127* 137 132* 134*  K 3.8 2.9* 3.9 3.4*  CL 93* 97*  --  99*  CO2 26  --  26 20*  GLUCOSE 342* 83 408* 132*  BUN 19 17 23.9 23*  CALCIUM 9.0  --  9.4 9.3  CREATININE 0.89 0.70 1.2 0.70  GFRNONAA >60  --   --  >60  GFRAA >60  --   --  >60    LIVER FUNCTION TESTS:  Recent Labs  03/13/15 1511 04/15/15 0915  BILITOT 0.7 0.68  AST 27 26  ALT 22  31  ALKPHOS 84 109  PROT 6.1* 6.6  ALBUMIN 3.8 3.7    Assessment and Plan: Patient with recently diagnosed metastatic pancreatic carcinoma. Plan today is for Port-A-Cath placement for chemotherapy.Risks and benefits discussed with the patient/wife including, but not limited to bleeding, infection, pneumothorax, or fibrin sheath development and need for additional procedures.All of the patient's questions were answered, patient is agreeable to proceed.Consent signed and in chart.     Electronically Signed: D. Rowe Roc 04/17/2015, 9:03 AM   I spent a total of 15 minutes at the the patient's bedside AND on the patient's hospital floor or unit, greater than 50% of which was  counseling/coordinating care for Port-A-Cath placement

## 2015-04-19 ENCOUNTER — Other Ambulatory Visit: Payer: Self-pay | Admitting: Oncology

## 2015-04-20 ENCOUNTER — Ambulatory Visit (HOSPITAL_BASED_OUTPATIENT_CLINIC_OR_DEPARTMENT_OTHER): Payer: Medicare Other

## 2015-04-20 ENCOUNTER — Other Ambulatory Visit: Payer: Medicare Other

## 2015-04-20 ENCOUNTER — Encounter: Payer: Self-pay | Admitting: *Deleted

## 2015-04-20 DIAGNOSIS — Z5111 Encounter for antineoplastic chemotherapy: Secondary | ICD-10-CM | POA: Diagnosis present

## 2015-04-20 DIAGNOSIS — C251 Malignant neoplasm of body of pancreas: Secondary | ICD-10-CM

## 2015-04-20 DIAGNOSIS — E119 Type 2 diabetes mellitus without complications: Secondary | ICD-10-CM

## 2015-04-20 LAB — WHOLE BLOOD GLUCOSE
Glucose: 263 mg/dL — ABNORMAL HIGH (ref 70–100)
HRS PC: 3 Hours

## 2015-04-20 MED ORDER — PALONOSETRON HCL INJECTION 0.25 MG/5ML
INTRAVENOUS | Status: AC
  Filled 2015-04-20: qty 5

## 2015-04-20 MED ORDER — PACLITAXEL PROTEIN-BOUND CHEMO INJECTION 100 MG
100.0000 mg/m2 | Freq: Once | INTRAVENOUS | Status: AC
  Administered 2015-04-20: 200 mg via INTRAVENOUS
  Filled 2015-04-20: qty 40

## 2015-04-20 MED ORDER — PALONOSETRON HCL INJECTION 0.25 MG/5ML
0.2500 mg | Freq: Once | INTRAVENOUS | Status: AC
  Administered 2015-04-20: 0.25 mg via INTRAVENOUS

## 2015-04-20 MED ORDER — GEMCITABINE HCL CHEMO INJECTION 1 GM/26.3ML
800.0000 mg/m2 | Freq: Once | INTRAVENOUS | Status: AC
  Administered 2015-04-20: 1520 mg via INTRAVENOUS
  Filled 2015-04-20: qty 39.98

## 2015-04-20 MED ORDER — SODIUM CHLORIDE 0.9 % IV SOLN
10.0000 mg | Freq: Once | INTRAVENOUS | Status: AC
  Administered 2015-04-20: 10 mg via INTRAVENOUS
  Filled 2015-04-20: qty 1

## 2015-04-20 MED ORDER — SODIUM CHLORIDE 0.9 % IJ SOLN
10.0000 mL | INTRAMUSCULAR | Status: DC | PRN
Start: 2015-04-20 — End: 2015-04-20
  Administered 2015-04-20: 10 mL
  Filled 2015-04-20: qty 10

## 2015-04-20 MED ORDER — SODIUM CHLORIDE 0.9 % IV SOLN
Freq: Once | INTRAVENOUS | Status: AC
  Administered 2015-04-20: 10:00:00 via INTRAVENOUS

## 2015-04-20 MED ORDER — HEPARIN SOD (PORK) LOCK FLUSH 100 UNIT/ML IV SOLN
500.0000 [IU] | Freq: Once | INTRAVENOUS | Status: AC | PRN
  Administered 2015-04-20: 500 [IU]
  Filled 2015-04-20: qty 5

## 2015-04-20 NOTE — Progress Notes (Signed)
Oncology Nurse Navigator Documentation  Oncology Nurse Navigator Flowsheets 04/20/2015  Navigator Location [facility]  Navigator Encounter Type Treatment  Abnormal Finding Date -  Confirmed Diagnosis Date -  Treatment Initiated Date 04/20/2015  Patient Visit Type MedOnc  Treatment Phase First Chemo Tx  Barriers/Navigation Needs No barriers at this time  Education Other-reviewed antiemetic regimen and EMLA cream and potential side effects  Interventions -Also discussed s/s of DVT in [facility] and to call  Education Method Teach-back  Support Groups/Services -  Acuity Level 1  Time Spent with Patient 15  His son was present during treatment and will drive him home.

## 2015-04-20 NOTE — Patient Instructions (Signed)
[facility] Discharge Instructions for Patients Receiving Chemotherapy  Today you received the following chemotherapy agents: Abraxane and Gemzar.  To help prevent nausea and vomiting after your treatment, we encourage you to take your nausea medication:  Compazine 10 mg every 6 hours as needed.eeeee  If you develop nausea and vomiting that is not controlled by your nausea medication, call the clinic.   BELOW ARE SYMPTOMS THAT SHOULD BE REPORTED IMMEDIATELY:  *FEVER GREATER THAN 100.5 F  *CHILLS WITH OR WITHOUT FEVER  NAUSEA AND VOMITING THAT IS NOT CONTROLLED WITH YOUR NAUSEA MEDICATION  *UNUSUAL SHORTNESS OF BREATH  *UNUSUAL BRUISING OR BLEEDING  TENDERNESS IN MOUTH AND THROAT WITH OR WITHOUT PRESENCE OF ULCERS  *URINARY PROBLEMS  *BOWEL PROBLEMS  UNUSUAL RASH Items with * indicate a potential emergency and should be followed up as soon as possible.  Feel free to call the clinic you have any questions or concerns. The clinic phone number is [phone].  Please show the [facility] at check-in to the Emergency Department and triage nurse.    Nanoparticle Albumin-Bound Paclitaxel injection What is this medicine? NANOPARTICLE ALBUMIN-BOUND PACLITAXEL (Na no PAHR ti kuhl al BYOO muhn-bound PAK li TAX el) is a chemotherapy drug. It targets fast dividing cells, like cancer cells, and causes these cells to die. This medicine is used to treat advanced breast cancer and advanced lung cancer. This medicine may be used for other purposes; ask your health care provider or pharmacist if you have questions. What should I tell my health care provider before I take this medicine? They need to know if you have any of these conditions: -kidney disease -liver disease -low blood counts, like low platelets, red blood cells, or white blood cells -recent or ongoing radiation therapy -an unusual or allergic reaction to paclitaxel, albumin, other chemotherapy, other  medicines, foods, dyes, or preservatives -pregnant or trying to get pregnant -breast-feeding How should I use this medicine? This drug is given as an infusion into a vein. It is administered in a hospital or clinic by a specially trained health care professional. Talk to your pediatrician regarding the use of this medicine in children. Special care may be needed. Overdosage: If you think you have taken too much of this medicine contact a poison control center or emergency room at once. NOTE: This medicine is only for you. Do not share this medicine with others. What if I miss a dose? It is important not to miss your dose. Call your doctor or health care professional if you are unable to keep an appointment. What may interact with this medicine? -cyclosporine -diazepam -ketoconazole -medicines to increase blood counts like filgrastim, pegfilgrastim, sargramostim -other chemotherapy drugs like cisplatin, doxorubicin, epirubicin, etoposide, teniposide, vincristine -quinidine -testosterone -vaccines -verapamil Talk to your doctor or health care professional before taking any of these medicines: -acetaminophen -aspirin -ibuprofen -ketoprofen -naproxen This list may not describe all possible interactions. Give your health care provider a list of all the medicines, herbs, non-prescription drugs, or dietary supplements you use. Also tell them if you smoke, drink alcohol, or use illegal drugs. Some items may interact with your medicine. What should I watch for while using this medicine? Your condition will be monitored carefully while you are receiving this medicine. You will need important blood work done while you are taking this medicine. This drug may make you feel generally unwell. This is not uncommon, as chemotherapy can affect healthy cells as well as cancer cells. Report any side effects.  Continue your course of treatment even though you feel ill unless your doctor tells you to stop. In  some cases, you may be given additional medicines to help with side effects. Follow all directions for their use. Call your doctor or health care professional for advice if you get a fever, chills or sore throat, or other symptoms of a cold or flu. Do not treat yourself. This drug decreases your body's ability to fight infections. Try to avoid being around people who are sick. This medicine may increase your risk to bruise or bleed. Call your doctor or health care professional if you notice any unusual bleeding. Be careful brushing and flossing your teeth or using a toothpick because you may get an infection or bleed more easily. If you have any dental work done, tell your dentist you are receiving this medicine. Avoid taking products that contain aspirin, acetaminophen, ibuprofen, naproxen, or ketoprofen unless instructed by your doctor. These medicines may hide a fever. Do not become pregnant while taking this medicine. Women should inform their doctor if they wish to become pregnant or think they might be pregnant. There is a potential for serious side effects to an unborn child. Talk to your health care professional or pharmacist for more information. Do not breast-feed an infant while taking this medicine. Men are advised not to father a child while receiving this medicine. What side effects may I notice from receiving this medicine? Side effects that you should report to your doctor or health care professional as soon as possible: -allergic reactions like skin rash, itching or hives, swelling of the face, lips, or tongue -low blood counts - This drug may decrease the number of white blood cells, red blood cells and platelets. You may be at increased risk for infections and bleeding. -signs of infection - fever or chills, cough, sore throat, pain or difficulty passing urine -signs of decreased platelets or bleeding - bruising, pinpoint red spots on the skin, black, tarry stools, nosebleeds -signs  of decreased red blood cells - unusually weak or tired, fainting spells, lightheadedness -breathing problems -changes in vision -chest pain -high or low blood pressure -mouth sores -nausea and vomiting -pain, swelling, redness or irritation at the injection site -pain, tingling, numbness in the hands or feet -slow or irregular heartbeat -swelling of the ankle, feet, hands Side effects that usually do not require medical attention (report to your doctor or health care professional if they continue or are bothersome): -aches, pains -changes in the color of fingernails -diarrhea -hair loss -loss of appetite This list may not describe all possible side effects. Call your doctor for medical advice about side effects. You may report side effects to FDA at 1-800-FDA-1088. Where should I keep my medicine? This drug is given in a hospital or clinic and will not be stored at home. NOTE: This sheet is a summary. It may not cover all possible information. If you have questions about this medicine, talk to your doctor, pharmacist, or health care provider.    2016, Elsevier/Gold Standard. (2012-05-14 16:48:    Gemcitabine injection What is this medicine? GEMCITABINE (jem SIT a been) is a chemotherapy drug. This medicine is used to treat many types of cancer like breast cancer, lung cancer, pancreatic cancer, and ovarian cancer. This medicine may be used for other purposes; ask your health care provider or pharmacist if you have questions. What should I tell my health care provider before I take this medicine? They need to know if you have any  of these conditions: -blood disorders -infection -kidney disease -liver disease -recent or ongoing radiation therapy -an unusual or allergic reaction to gemcitabine, other chemotherapy, other medicines, foods, dyes, or preservatives -pregnant or trying to get pregnant -breast-feeding How should I use this medicine? This drug is given as an infusion  into a vein. It is administered in a hospital or clinic by a specially trained health care professional. Talk to your pediatrician regarding the use of this medicine in children. Special care may be needed. Overdosage: If you think you have taken too much of this medicine contact a poison control center or emergency room at once. NOTE: This medicine is only for you. Do not share this medicine with others. What if I miss a dose? It is important not to miss your dose. Call your doctor or health care professional if you are unable to keep an appointment. What may interact with this medicine? -medicines to increase blood counts like filgrastim, pegfilgrastim, sargramostim -some other chemotherapy drugs like cisplatin -vaccines Talk to your doctor or health care professional before taking any of these medicines: -acetaminophen -aspirin -ibuprofen -ketoprofen -naproxen This list may not describe all possible interactions. Give your health care provider a list of all the medicines, herbs, non-prescription drugs, or dietary supplements you use. Also tell them if you smoke, drink alcohol, or use illegal drugs. Some items may interact with your medicine. What should I watch for while using this medicine? Visit your doctor for checks on your progress. This drug may make you feel generally unwell. This is not uncommon, as chemotherapy can affect healthy cells as well as cancer cells. Report any side effects. Continue your course of treatment even though you feel ill unless your doctor tells you to stop. In some cases, you may be given additional medicines to help with side effects. Follow all directions for their use. Call your doctor or health care professional for advice if you get a fever, chills or sore throat, or other symptoms of a cold or flu. Do not treat yourself. This drug decreases your body's ability to fight infections. Try to avoid being around people who are sick. This medicine may increase  your risk to bruise or bleed. Call your doctor or health care professional if you notice any unusual bleeding. Be careful brushing and flossing your teeth or using a toothpick because you may get an infection or bleed more easily. If you have any dental work done, tell your dentist you are receiving this medicine. Avoid taking products that contain aspirin, acetaminophen, ibuprofen, naproxen, or ketoprofen unless instructed by your doctor. These medicines may hide a fever. Women should inform their doctor if they wish to become pregnant or think they might be pregnant. There is a potential for serious side effects to an unborn child. Talk to your health care professional or pharmacist for more information. Do not breast-feed an infant while taking this medicine. What side effects may I notice from receiving this medicine? Side effects that you should report to your doctor or health care professional as soon as possible: -allergic reactions like skin rash, itching or hives, swelling of the face, lips, or tongue -low blood counts - this medicine may decrease the number of white blood cells, red blood cells and platelets. You may be at increased risk for infections and bleeding. -signs of infection - fever or chills, cough, sore throat, pain or difficulty passing urine -signs of decreased platelets or bleeding - bruising, pinpoint red spots on the skin, black, tarry  stools, blood in the urine -signs of decreased red blood cells - unusually weak or tired, fainting spells, lightheadedness -breathing problems -chest pain -mouth sores -nausea and vomiting -pain, swelling, redness at site where injected -pain, tingling, numbness in the hands or feet -stomach pain -swelling of ankles, feet, hands -unusual bleeding Side effects that usually do not require medical attention (report to your doctor or health care professional if they continue or are bothersome): -constipation -diarrhea -hair loss -loss of  appetite -stomach upset This list may not describe all possible side effects. Call your doctor for medical advice about side effects. You may report side effects to FDA at 1-800-FDA-1088. Where should I keep my medicine? This drug is given in a hospital or clinic and will not be stored at home. NOTE: This sheet is a summary. It may not cover all possible information. If you have questions about this medicine, talk to your doctor, pharmacist, or health care provider.    2016, Elsevier/Gold Standard. (2007-07-31 18:45:54)

## 2015-04-21 ENCOUNTER — Telehealth: Payer: Self-pay | Admitting: *Deleted

## 2015-04-21 NOTE — Telephone Encounter (Signed)
Pt called to say his temp was 100.6. No signs of infection.   Dr Benay Spice notified- feels it is related to Gemzar, had first treatment yesterday. Ok to take ibuprofen. Pt verbalized understanding. States temp has gone down now

## 2015-04-21 NOTE — Telephone Encounter (Signed)
Pt reports "I'm not experiencing any problems so far; I feel good"  Pt denies fever/N/V; no diarrhea; eating small meals throughout the day with ensure supplements. Reports from yesterday to today "my scale says I've lost 3 lbs"  Informed pt to monitor for now.  Pt verbalized understanding to call office if problems and confirmed next office visit 05/03/14.

## 2015-04-21 NOTE — Telephone Encounter (Signed)
-----   Message from Otila Kluver, RN sent at 04/20/2015  1:29 PM EST ----- Regarding: sherrill. 1st time chemo Contact: [phone] 1st time Abraxane and Gemzar. Tolerated well.  No evidence of reaction

## 2015-05-04 ENCOUNTER — Ambulatory Visit (HOSPITAL_BASED_OUTPATIENT_CLINIC_OR_DEPARTMENT_OTHER): Payer: Medicare Other

## 2015-05-04 ENCOUNTER — Telehealth: Payer: Self-pay | Admitting: Oncology

## 2015-05-04 ENCOUNTER — Other Ambulatory Visit (HOSPITAL_BASED_OUTPATIENT_CLINIC_OR_DEPARTMENT_OTHER): Payer: Medicare Other

## 2015-05-04 ENCOUNTER — Ambulatory Visit: Payer: Medicare Other | Admitting: Nutrition

## 2015-05-04 ENCOUNTER — Telehealth: Payer: Self-pay | Admitting: *Deleted

## 2015-05-04 ENCOUNTER — Ambulatory Visit (HOSPITAL_BASED_OUTPATIENT_CLINIC_OR_DEPARTMENT_OTHER): Payer: Medicare Other | Admitting: Oncology

## 2015-05-04 VITALS — BP 109/64 | HR 69 | Temp 97.9°F | Resp 18 | Ht 72.0 in | Wt 148.0 lb

## 2015-05-04 DIAGNOSIS — Z5111 Encounter for antineoplastic chemotherapy: Secondary | ICD-10-CM

## 2015-05-04 DIAGNOSIS — C787 Secondary malignant neoplasm of liver and intrahepatic bile duct: Secondary | ICD-10-CM

## 2015-05-04 DIAGNOSIS — R634 Abnormal weight loss: Secondary | ICD-10-CM

## 2015-05-04 DIAGNOSIS — C251 Malignant neoplasm of body of pancreas: Secondary | ICD-10-CM | POA: Diagnosis present

## 2015-05-04 DIAGNOSIS — E119 Type 2 diabetes mellitus without complications: Secondary | ICD-10-CM

## 2015-05-04 LAB — CBC WITH DIFFERENTIAL/PLATELET
BASO%: 1 % (ref 0.0–2.0)
BASOS ABS: 0.1 10*3/uL (ref 0.0–0.1)
EOS ABS: 0.1 10*3/uL (ref 0.0–0.5)
EOS%: 2 % (ref 0.0–7.0)
HEMATOCRIT: 36.8 % — AB (ref 38.4–49.9)
HGB: 12.2 g/dL — ABNORMAL LOW (ref 13.0–17.1)
LYMPH#: 0.9 10*3/uL (ref 0.9–3.3)
LYMPH%: 14 % (ref 14.0–49.0)
MCH: 31.6 pg (ref 27.2–33.4)
MCHC: 33.1 g/dL (ref 32.0–36.0)
MCV: 95.6 fL (ref 79.3–98.0)
MONO#: 0.7 10*3/uL (ref 0.1–0.9)
MONO%: 10.2 % (ref 0.0–14.0)
NEUT#: 4.7 10*3/uL (ref 1.5–6.5)
NEUT%: 72.8 % (ref 39.0–75.0)
PLATELETS: 188 10*3/uL (ref 140–400)
RBC: 3.85 10*6/uL — ABNORMAL LOW (ref 4.20–5.82)
RDW: 13.4 % (ref 11.0–14.6)
WBC: 6.5 10*3/uL (ref 4.0–10.3)

## 2015-05-04 LAB — COMPREHENSIVE METABOLIC PANEL
ALT: 42 U/L (ref 0–55)
ANION GAP: 9 meq/L (ref 3–11)
AST: 31 U/L (ref 5–34)
Albumin: 3.6 g/dL (ref 3.5–5.0)
Alkaline Phosphatase: 89 U/L (ref 40–150)
BUN: 18.6 mg/dL (ref 7.0–26.0)
CALCIUM: 9.3 mg/dL (ref 8.4–10.4)
CHLORIDE: 104 meq/L (ref 98–109)
CO2: 25 mEq/L (ref 22–29)
CREATININE: 1 mg/dL (ref 0.7–1.3)
EGFR: 75 mL/min/{1.73_m2} — ABNORMAL LOW (ref >90)
Glucose: 278 mg/dl — ABNORMAL HIGH (ref 70–140)
Potassium: 4 mEq/L (ref 3.5–5.1)
Sodium: 138 mEq/L (ref 136–145)
Total Bilirubin: 0.74 mg/dL (ref 0.20–1.20)
Total Protein: 6.5 g/dL (ref 6.4–8.3)

## 2015-05-04 MED ORDER — HEPARIN SOD (PORK) LOCK FLUSH 100 UNIT/ML IV SOLN
500.0000 [IU] | Freq: Once | INTRAVENOUS | Status: AC | PRN
  Administered 2015-05-04: 500 [IU]
  Filled 2015-05-04: qty 5

## 2015-05-04 MED ORDER — PALONOSETRON HCL INJECTION 0.25 MG/5ML
INTRAVENOUS | Status: AC
  Filled 2015-05-04: qty 5

## 2015-05-04 MED ORDER — SODIUM CHLORIDE 0.9 % IJ SOLN
10.0000 mL | INTRAMUSCULAR | Status: DC | PRN
  Administered 2015-05-04: 10 mL
  Filled 2015-05-04: qty 10

## 2015-05-04 MED ORDER — SODIUM CHLORIDE 0.9 % IV SOLN
Freq: Once | INTRAVENOUS | Status: AC
  Administered 2015-05-04: 11:00:00 via INTRAVENOUS

## 2015-05-04 MED ORDER — SODIUM CHLORIDE 0.9 % IV SOLN
800.0000 mg/m2 | Freq: Once | INTRAVENOUS | Status: AC
  Administered 2015-05-04: 1520 mg via INTRAVENOUS
  Filled 2015-05-04: qty 39.98

## 2015-05-04 MED ORDER — PALONOSETRON HCL INJECTION 0.25 MG/5ML
0.2500 mg | Freq: Once | INTRAVENOUS | Status: AC
  Administered 2015-05-04: 0.25 mg via INTRAVENOUS

## 2015-05-04 MED ORDER — PACLITAXEL PROTEIN-BOUND CHEMO INJECTION 100 MG
100.0000 mg/m2 | Freq: Once | INTRAVENOUS | Status: AC
  Administered 2015-05-04: 200 mg via INTRAVENOUS
  Filled 2015-05-04: qty 40

## 2015-05-04 MED ORDER — SODIUM CHLORIDE 0.9 % IV SOLN
10.0000 mg | Freq: Once | INTRAVENOUS | Status: AC
  Administered 2015-05-04: 10 mg via INTRAVENOUS
  Filled 2015-05-04: qty 1

## 2015-05-04 NOTE — Patient Instructions (Signed)
[facility] Discharge Instructions for Patients Receiving Chemotherapy  Today you received the following chemotherapy agents: Abraxane and Gemzar.  To help prevent nausea and vomiting after your treatment, we encourage you to take your nausea medication:Compazine 5 mg every 6 hours as needed.   If you develop nausea and vomiting that is not controlled by your nausea medication, call the clinic.   BELOW ARE SYMPTOMS THAT SHOULD BE REPORTED IMMEDIATELY:  *FEVER GREATER THAN 100.5 F  *CHILLS WITH OR WITHOUT FEVER  NAUSEA AND VOMITING THAT IS NOT CONTROLLED WITH YOUR NAUSEA MEDICATION  *UNUSUAL SHORTNESS OF BREATH  *UNUSUAL BRUISING OR BLEEDING  TENDERNESS IN MOUTH AND THROAT WITH OR WITHOUT PRESENCE OF ULCERS  *URINARY PROBLEMS  *BOWEL PROBLEMS  UNUSUAL RASH Items with * indicate a potential emergency and should be followed up as soon as possible.  Feel free to call the clinic you have any questions or concerns. The clinic phone number is [phone].  Please show the [facility] at check-in to the Emergency Department and triage nurse.

## 2015-05-04 NOTE — Telephone Encounter (Signed)
Per staff message and POF I have scheduled appts. Advised scheduler of appts. JMW  

## 2015-05-04 NOTE — Progress Notes (Signed)
  [facility] OFFICE PROGRESS NOTE   Diagnosis: Pancreas cancer  INTERVAL HISTORY:   Mr. [name] returns as scheduled. He completed a first treatment with gemcitabine/Abraxane 04/20/2015. No fever, nausea, or neuropathy symptoms following chemotherapy. He reports "leg weakness "on day 3 following chemotherapy. This has resolved. He has altered taste, but is eating.  Objective:  Vital signs in last 24 hours:  Blood pressure 109/64, pulse 69, temperature 97.9 F (36.6 C), temperature source Oral, resp. rate 18, height 6' (1.829 m), weight 148 lb (67.132 kg), SpO2 99 %.    HEENT: Mild whitecoat over the tongue, no buccal thrush, no ulcers Resp: Lungs clear bilaterally Cardio: Regular rate and rhythm GI: No hepatosplenomegaly, nontender, no mass Vascular: No leg edema   Portacath/PICC-without erythema  Lab Results:  Lab Results  Component Value Date   WBC 6.5 05/04/2015   HGB 12.2* 05/04/2015   HCT 36.8* 05/04/2015   MCV 95.6 05/04/2015   PLT 188 05/04/2015   NEUTROABS 4.7 05/04/2015   CA 19-9 on 04/15/2015: 26,403  Medications: I have reviewed the patient's current medications.  Assessment/Plan: 1. Metastatic pancreas cancer  CT abdomen/pelvis 03/24/2015 confirmed a pancreas body mass with involvement of the celiac axis,SMV, and splenic vein. Multiple liver metastases  Ultrasound-guided biopsy of a right liver lesion on 04/06/2014 confirmed metastatic adenocarcinoma  Cycle 1 gemcitabine/Abraxane 04/20/2015  2. Weight loss secondary to metastatic pancreas cancer versus diabetes  3. Diabetes    Disposition:  Mr. Elza tolerated the first cycle of chemotherapy well. The plan is to proceed with cycle 2 today. He will return for an office visit and chemotherapy in 2 weeks. We will check the CA 27-29 when he returns in 2 weeks.  Betsy Coder, MD  05/04/2015  10:41 AM

## 2015-05-04 NOTE — Telephone Encounter (Signed)
Pt confirmed labs/ov per 01/30 POF, gave pt AVS and Calendar... KJ, sent msg to add chemo °

## 2015-05-04 NOTE — Progress Notes (Signed)
Nutrition follow-up completed with patient in chemotherapy for metastatic pancreas cancer. Weight decreased and documented as 148 pounds January 30 decreased from 155.5 pounds January 6. Patient states he was avoiding many foods that were elevating his blood sugars and therefore lost a lot of weight. Patient reports his physician has recommended a different glycemic range for his blood sugars and he has been able to eat more. He continues to consume Glucerna drinks and bars. Reports he had success with using miracle Berries secondary to taste alterations.  Nutrition diagnosis: Food and nutrition related knowledge deficit has improved.  Intervention:  Patient educated to consume small frequent meals and snacks with high-calorie, high-protein foods. Recommended patient continue protein bars and drinks. Provided education on taste alterations.  I provided fact sheets Questions were answered.  Teach back method used.  Monitoring, evaluation, goals: Patient will tolerate increased calories and protein to minimize weight loss and stay within appropriate glycemic control.  Next visit: To be scheduled as needed.  **Disclaimer: This note was dictated with voice recognition software. Similar sounding words can inadvertently be transcribed and this note may contain transcription errors which may not have been corrected upon publication of note.**

## 2015-05-16 ENCOUNTER — Other Ambulatory Visit: Payer: Self-pay | Admitting: Oncology

## 2015-05-18 ENCOUNTER — Ambulatory Visit (HOSPITAL_BASED_OUTPATIENT_CLINIC_OR_DEPARTMENT_OTHER): Payer: Medicare Other

## 2015-05-18 ENCOUNTER — Other Ambulatory Visit (HOSPITAL_BASED_OUTPATIENT_CLINIC_OR_DEPARTMENT_OTHER): Payer: Medicare Other

## 2015-05-18 ENCOUNTER — Ambulatory Visit: Payer: Medicare Other

## 2015-05-18 ENCOUNTER — Telehealth: Payer: Self-pay | Admitting: Oncology

## 2015-05-18 ENCOUNTER — Telehealth: Payer: Self-pay | Admitting: *Deleted

## 2015-05-18 ENCOUNTER — Ambulatory Visit (HOSPITAL_BASED_OUTPATIENT_CLINIC_OR_DEPARTMENT_OTHER): Payer: Medicare Other | Admitting: Oncology

## 2015-05-18 VITALS — BP 139/72 | HR 61 | Temp 97.6°F | Ht 72.0 in | Wt 149.8 lb

## 2015-05-18 DIAGNOSIS — C251 Malignant neoplasm of body of pancreas: Secondary | ICD-10-CM

## 2015-05-18 DIAGNOSIS — Z95828 Presence of other vascular implants and grafts: Secondary | ICD-10-CM

## 2015-05-18 DIAGNOSIS — E119 Type 2 diabetes mellitus without complications: Secondary | ICD-10-CM | POA: Diagnosis not present

## 2015-05-18 DIAGNOSIS — R634 Abnormal weight loss: Secondary | ICD-10-CM

## 2015-05-18 DIAGNOSIS — C787 Secondary malignant neoplasm of liver and intrahepatic bile duct: Secondary | ICD-10-CM | POA: Diagnosis not present

## 2015-05-18 DIAGNOSIS — Z5111 Encounter for antineoplastic chemotherapy: Secondary | ICD-10-CM

## 2015-05-18 LAB — CBC WITH DIFFERENTIAL/PLATELET
BASO%: 0.5 % (ref 0.0–2.0)
Basophils Absolute: 0 10*3/uL (ref 0.0–0.1)
EOS%: 2.1 % (ref 0.0–7.0)
Eosinophils Absolute: 0.1 10*3/uL (ref 0.0–0.5)
HCT: 33 % — ABNORMAL LOW (ref 38.4–49.9)
HGB: 10.8 g/dL — ABNORMAL LOW (ref 13.0–17.1)
LYMPH%: 14.2 % (ref 14.0–49.0)
MCH: 31.6 pg (ref 27.2–33.4)
MCHC: 32.7 g/dL (ref 32.0–36.0)
MCV: 96.5 fL (ref 79.3–98.0)
MONO#: 0.9 10*3/uL (ref 0.1–0.9)
MONO%: 15.2 % — AB (ref 0.0–14.0)
NEUT%: 68 % (ref 39.0–75.0)
NEUTROS ABS: 4.2 10*3/uL (ref 1.5–6.5)
NRBC: 0 % (ref 0–0)
Platelets: 131 10*3/uL — ABNORMAL LOW (ref 140–400)
RBC: 3.42 10*6/uL — AB (ref 4.20–5.82)
RDW: 14.5 % (ref 11.0–14.6)
WBC: 6.1 10*3/uL (ref 4.0–10.3)
lymph#: 0.9 10*3/uL (ref 0.9–3.3)

## 2015-05-18 LAB — COMPREHENSIVE METABOLIC PANEL
ALK PHOS: 78 U/L (ref 40–150)
ALT: 36 U/L (ref 0–55)
AST: 26 U/L (ref 5–34)
Albumin: 3.3 g/dL — ABNORMAL LOW (ref 3.5–5.0)
Anion Gap: 8 mEq/L (ref 3–11)
BUN: 20.2 mg/dL (ref 7.0–26.0)
CALCIUM: 9 mg/dL (ref 8.4–10.4)
CO2: 26 mEq/L (ref 22–29)
CREATININE: 0.9 mg/dL (ref 0.7–1.3)
Chloride: 105 mEq/L (ref 98–109)
EGFR: 83 mL/min/{1.73_m2} — ABNORMAL LOW (ref >90)
GLUCOSE: 192 mg/dL — AB (ref 70–140)
POTASSIUM: 4.1 meq/L (ref 3.5–5.1)
Sodium: 139 mEq/L (ref 136–145)
TOTAL PROTEIN: 5.8 g/dL — AB (ref 6.4–8.3)
Total Bilirubin: 0.49 mg/dL (ref 0.20–1.20)

## 2015-05-18 MED ORDER — PALONOSETRON HCL INJECTION 0.25 MG/5ML
0.2500 mg | Freq: Once | INTRAVENOUS | Status: AC
Start: 2015-05-18 — End: 2015-05-18
  Administered 2015-05-18: 0.25 mg via INTRAVENOUS

## 2015-05-18 MED ORDER — PACLITAXEL PROTEIN-BOUND CHEMO INJECTION 100 MG
100.0000 mg/m2 | Freq: Once | INTRAVENOUS | Status: AC
  Administered 2015-05-18: 200 mg via INTRAVENOUS
  Filled 2015-05-18: qty 40

## 2015-05-18 MED ORDER — HEPARIN SOD (PORK) LOCK FLUSH 100 UNIT/ML IV SOLN
500.0000 [IU] | Freq: Once | INTRAVENOUS | Status: AC | PRN
  Administered 2015-05-18: 500 [IU]
  Filled 2015-05-18: qty 5

## 2015-05-18 MED ORDER — SODIUM CHLORIDE 0.9 % IV SOLN
Freq: Once | INTRAVENOUS | Status: AC
  Administered 2015-05-18: 11:00:00 via INTRAVENOUS

## 2015-05-18 MED ORDER — SODIUM CHLORIDE 0.9% FLUSH
10.0000 mL | INTRAVENOUS | Status: DC | PRN
  Administered 2015-05-18: 10 mL via INTRAVENOUS
  Filled 2015-05-18: qty 10

## 2015-05-18 MED ORDER — SODIUM CHLORIDE 0.9 % IJ SOLN
10.0000 mL | INTRAMUSCULAR | Status: DC | PRN
  Administered 2015-05-18: 10 mL
  Filled 2015-05-18: qty 10

## 2015-05-18 MED ORDER — SODIUM CHLORIDE 0.9 % IV SOLN
10.0000 mg | Freq: Once | INTRAVENOUS | Status: AC
  Administered 2015-05-18: 10 mg via INTRAVENOUS
  Filled 2015-05-18: qty 1

## 2015-05-18 MED ORDER — SODIUM CHLORIDE 0.9 % IV SOLN
800.0000 mg/m2 | Freq: Once | INTRAVENOUS | Status: AC
  Administered 2015-05-18: 1520 mg via INTRAVENOUS
  Filled 2015-05-18: qty 39.98

## 2015-05-18 NOTE — Patient Instructions (Signed)
[facility] Discharge Instructions for Patients Receiving Chemotherapy  Today you received the following chemotherapy agents: Abraxane and Gemzar.  To help prevent nausea and vomiting after your treatment, we encourage you to take your nausea medication:Compazine 5 mg every 6 hours as needed.   If you develop nausea and vomiting that is not controlled by your nausea medication, call the clinic.   BELOW ARE SYMPTOMS THAT SHOULD BE REPORTED IMMEDIATELY:  *FEVER GREATER THAN 100.5 F  *CHILLS WITH OR WITHOUT FEVER  NAUSEA AND VOMITING THAT IS NOT CONTROLLED WITH YOUR NAUSEA MEDICATION  *UNUSUAL SHORTNESS OF BREATH  *UNUSUAL BRUISING OR BLEEDING  TENDERNESS IN MOUTH AND THROAT WITH OR WITHOUT PRESENCE OF ULCERS  *URINARY PROBLEMS  *BOWEL PROBLEMS  UNUSUAL RASH Items with * indicate a potential emergency and should be followed up as soon as possible.  Feel free to call the clinic you have any questions or concerns. The clinic phone number is [phone].  Please show the [facility] at check-in to the Emergency Department and triage nurse.

## 2015-05-18 NOTE — Telephone Encounter (Signed)
per pof to sch pt appt-gave pt copy of avs-sent MW email to sch trmrt-pt to get udpated copyb4 leaving

## 2015-05-18 NOTE — Progress Notes (Signed)
  [facility] OFFICE PROGRESS NOTE   Diagnosis: Pancreas cancer  INTERVAL HISTORY:   [name] returns as scheduled. He completed another treatment with gemcitabine/Abraxane 05/04/2015. No fever or rash following chemotherapy. No abdominal pain. Good appetite. He reports malaise for a few days following chemotherapy.  Objective:  Vital signs in last 24 hours:  Blood pressure 139/72, pulse 61, temperature 97.6 F (36.4 C), temperature source Oral, height 6' (1.829 m), weight 149 lb 12.8 oz (67.949 kg), SpO2 99 %.    HEENT: No thrush or ulcers Resp: Lungs clear bilaterally Cardio: Regular rate and rhythm GI: No hepatosplenomegaly, no mass Vascular: No leg edema    Portacath/PICC-without erythema  Lab Results:  Lab Results  Component Value Date   WBC 6.1 05/18/2015   HGB 10.8* 05/18/2015   HCT 33.0* 05/18/2015   MCV 96.5 05/18/2015   PLT 131* 05/18/2015   NEUTROABS 4.2 05/18/2015     Medications: I have reviewed the patient's current medications.  Assessment/Plan: 1. Metastatic pancreas cancer  CT abdomen/pelvis 03/24/2015 confirmed a pancreas body mass with involvement of the celiac axis,SMV, and splenic vein. Multiple liver metastases  Ultrasound-guided biopsy of a right liver lesion on 04/06/2014 confirmed metastatic adenocarcinoma  Cycle 1 gemcitabine/Abraxane 04/20/2015  Cycle 2 gemcitabine/Abraxane 05/04/2015  Cycle 3 gemcitabine/Abraxane 05/18/2015  2. Weight loss secondary to metastatic pancreas cancer versus diabetes  3. Diabetes     Disposition:  [name] appears to be tolerating the chemotherapy well. His clinical status is stable. The plan is to proceed with cycle 3 gemcitabine/Abraxane today. We will follow-up on the CA 19-9 from today. He will return for an office visit and chemotherapy in 2 weeks.  Betsy Coder, MD  05/18/2015  3:10 PM

## 2015-05-18 NOTE — Telephone Encounter (Signed)
Per staff message and POF I have scheduled appts. Advised scheduler of appts. JMW  

## 2015-05-19 ENCOUNTER — Telehealth: Payer: Self-pay | Admitting: *Deleted

## 2015-05-19 LAB — CANCER ANTIGEN 19-9

## 2015-05-19 NOTE — Telephone Encounter (Signed)
-----   Message from Ladell Pier, MD sent at 05/19/2015  1:10 PM EST ----- Please call patient, [name] slightly improved, f/u as scheduled

## 2015-05-19 NOTE — Telephone Encounter (Signed)
Per Dr. Benay Spice; notified pt and wife that CA19.9 is slightly improved.  Pt verbalized understanding and confirmed appt for 06/01/15.

## 2015-06-01 ENCOUNTER — Ambulatory Visit (HOSPITAL_BASED_OUTPATIENT_CLINIC_OR_DEPARTMENT_OTHER): Payer: Medicare Other | Admitting: Oncology

## 2015-06-01 ENCOUNTER — Ambulatory Visit (HOSPITAL_BASED_OUTPATIENT_CLINIC_OR_DEPARTMENT_OTHER): Payer: Medicare Other

## 2015-06-01 ENCOUNTER — Ambulatory Visit: Payer: Medicare Other

## 2015-06-01 ENCOUNTER — Telehealth: Payer: Self-pay | Admitting: Oncology

## 2015-06-01 ENCOUNTER — Other Ambulatory Visit (HOSPITAL_BASED_OUTPATIENT_CLINIC_OR_DEPARTMENT_OTHER): Payer: Medicare Other

## 2015-06-01 VITALS — BP 119/71 | HR 69 | Temp 97.6°F | Resp 17 | Ht 72.0 in | Wt 150.1 lb

## 2015-06-01 DIAGNOSIS — Z5111 Encounter for antineoplastic chemotherapy: Secondary | ICD-10-CM | POA: Diagnosis present

## 2015-06-01 DIAGNOSIS — C251 Malignant neoplasm of body of pancreas: Secondary | ICD-10-CM

## 2015-06-01 DIAGNOSIS — C787 Secondary malignant neoplasm of liver and intrahepatic bile duct: Secondary | ICD-10-CM

## 2015-06-01 DIAGNOSIS — E119 Type 2 diabetes mellitus without complications: Secondary | ICD-10-CM

## 2015-06-01 LAB — CBC WITH DIFFERENTIAL/PLATELET
BASO%: 1.1 % (ref 0.0–2.0)
BASOS ABS: 0.1 10*3/uL (ref 0.0–0.1)
EOS ABS: 0.2 10*3/uL (ref 0.0–0.5)
EOS%: 2.9 % (ref 0.0–7.0)
HEMATOCRIT: 33.3 % — AB (ref 38.4–49.9)
HEMOGLOBIN: 11.1 g/dL — AB (ref 13.0–17.1)
LYMPH#: 0.7 10*3/uL — AB (ref 0.9–3.3)
LYMPH%: 11.5 % — ABNORMAL LOW (ref 14.0–49.0)
MCH: 32.4 pg (ref 27.2–33.4)
MCHC: 33.3 g/dL (ref 32.0–36.0)
MCV: 97.3 fL (ref 79.3–98.0)
MONO#: 0.8 10*3/uL (ref 0.1–0.9)
MONO%: 14 % (ref 0.0–14.0)
NEUT%: 70.5 % (ref 39.0–75.0)
NEUTROS ABS: 4.1 10*3/uL (ref 1.5–6.5)
PLATELETS: 155 10*3/uL (ref 140–400)
RBC: 3.42 10*6/uL — ABNORMAL LOW (ref 4.20–5.82)
RDW: 15.2 % — AB (ref 11.0–14.6)
WBC: 5.8 10*3/uL (ref 4.0–10.3)

## 2015-06-01 LAB — COMPREHENSIVE METABOLIC PANEL
ALBUMIN: 3.3 g/dL — AB (ref 3.5–5.0)
ALK PHOS: 74 U/L (ref 40–150)
ALT: 30 U/L (ref 0–55)
ANION GAP: 8 meq/L (ref 3–11)
AST: 26 U/L (ref 5–34)
BILIRUBIN TOTAL: 0.44 mg/dL (ref 0.20–1.20)
BUN: 16.3 mg/dL (ref 7.0–26.0)
CALCIUM: 8.7 mg/dL (ref 8.4–10.4)
CO2: 24 mEq/L (ref 22–29)
Chloride: 105 mEq/L (ref 98–109)
Creatinine: 0.8 mg/dL (ref 0.7–1.3)
EGFR: 83 mL/min/{1.73_m2} — AB (ref >90)
GLUCOSE: 223 mg/dL — AB (ref 70–140)
POTASSIUM: 3.9 meq/L (ref 3.5–5.1)
Sodium: 138 mEq/L (ref 136–145)
TOTAL PROTEIN: 5.9 g/dL — AB (ref 6.4–8.3)

## 2015-06-01 MED ORDER — SODIUM CHLORIDE 0.9 % IV SOLN
800.0000 mg/m2 | Freq: Once | INTRAVENOUS | Status: AC
  Administered 2015-06-01: 1520 mg via INTRAVENOUS
  Filled 2015-06-01: qty 39.98

## 2015-06-01 MED ORDER — HEPARIN SOD (PORK) LOCK FLUSH 100 UNIT/ML IV SOLN
500.0000 [IU] | Freq: Once | INTRAVENOUS | Status: AC | PRN
  Administered 2015-06-01: 500 [IU]
  Filled 2015-06-01: qty 5

## 2015-06-01 MED ORDER — PALONOSETRON HCL INJECTION 0.25 MG/5ML
0.2500 mg | Freq: Once | INTRAVENOUS | Status: AC
  Administered 2015-06-01: 0.25 mg via INTRAVENOUS

## 2015-06-01 MED ORDER — SODIUM CHLORIDE 0.9 % IJ SOLN
10.0000 mL | INTRAMUSCULAR | Status: DC | PRN
  Administered 2015-06-01: 10 mL
  Filled 2015-06-01: qty 10

## 2015-06-01 MED ORDER — PACLITAXEL PROTEIN-BOUND CHEMO INJECTION 100 MG
100.0000 mg/m2 | Freq: Once | INTRAVENOUS | Status: AC
  Administered 2015-06-01: 200 mg via INTRAVENOUS
  Filled 2015-06-01: qty 40

## 2015-06-01 MED ORDER — SODIUM CHLORIDE 0.9 % IV SOLN
10.0000 mg | Freq: Once | INTRAVENOUS | Status: AC
  Administered 2015-06-01: 10 mg via INTRAVENOUS
  Filled 2015-06-01: qty 1

## 2015-06-01 MED ORDER — SODIUM CHLORIDE 0.9 % IV SOLN
Freq: Once | INTRAVENOUS | Status: AC
  Administered 2015-06-01: 11:00:00 via INTRAVENOUS

## 2015-06-01 MED ORDER — SODIUM CHLORIDE 0.9% FLUSH
10.0000 mL | INTRAVENOUS | Status: DC | PRN
  Administered 2015-06-01: 10 mL via INTRAVENOUS
  Filled 2015-06-01: qty 10

## 2015-06-01 NOTE — Telephone Encounter (Signed)
appt made and avs printed. Contrast given to pt. CT to be scheduled.

## 2015-06-01 NOTE — Progress Notes (Signed)
  [facility] OFFICE PROGRESS NOTE   Diagnosis: Pancreas cancer  INTERVAL HISTORY:   [name] returns as scheduled. He completed another treatment with gemcitabine/Abraxane on 05/18/2015. No fever or rash. No change in baseline neuropathy symptoms. He feels well. Good appetite.  Objective:  Vital signs in last 24 hours:  Blood pressure 119/71, pulse 69, temperature 97.6 F (36.4 C), temperature source Oral, resp. rate 17, height 6' (1.829 m), weight 150 lb 1.6 oz (68.085 kg), SpO2 97 %.    HEENT: No thrush or ulcers Resp: Lungs clear bilaterally Cardio: Regular rate and rhythm GI: No hepatomegaly, no mass, nontender Vascular: No leg edema  Portacath/PICC-without erythema  Lab Results:  Lab Results  Component Value Date   WBC 5.8 06/01/2015   HGB 11.1* 06/01/2015   HCT 33.3* 06/01/2015   MCV 97.3 06/01/2015   PLT 155 06/01/2015   NEUTROABS 4.1 06/01/2015    Medications: I have reviewed the patient's current medications.  Assessment/Plan: 1. Metastatic pancreas cancer  CT abdomen/pelvis 03/24/2015 confirmed a pancreas body mass with involvement of the celiac axis,SMV, and splenic vein. Multiple liver metastases  Ultrasound-guided biopsy of a right liver lesion on 04/06/2014 confirmed metastatic adenocarcinoma  Cycle 1 gemcitabine/Abraxane 04/20/2015  Cycle 2 gemcitabine/Abraxane 05/04/2015  Cycle 3 gemcitabine/Abraxane 05/18/2015  Cycle 4 gemcitabine/Abraxane 06/01/2015  2. Weight loss secondary to metastatic pancreas cancer versus diabetes  3. Diabetes    Disposition:  Mr. Maruska is tolerating the gemcitabine/Abraxane well. He has a good performance status. The CA 19-9 was slightly improved on 05/18/2015. The plan is to proceed with cycle 4 gemcitabine/Abraxane today. He will return for cycle 5 in 2 weeks. We will schedule a restaging CT after cycle 5.  Betsy Coder, MD  06/01/2015  9:31 AM

## 2015-06-01 NOTE — Patient Instructions (Signed)

## 2015-06-01 NOTE — Patient Instructions (Signed)
[facility] Discharge Instructions for Patients Receiving Chemotherapy  Today you received the following chemotherapy agents Abraxane/Gemzar  To help prevent nausea and vomiting after your treatment, we encourage you to take your nausea medication    If you develop nausea and vomiting that is not controlled by your nausea medication, call the clinic.   BELOW ARE SYMPTOMS THAT SHOULD BE REPORTED IMMEDIATELY:  *FEVER GREATER THAN 100.5 F  *CHILLS WITH OR WITHOUT FEVER  NAUSEA AND VOMITING THAT IS NOT CONTROLLED WITH YOUR NAUSEA MEDICATION  *UNUSUAL SHORTNESS OF BREATH  *UNUSUAL BRUISING OR BLEEDING  TENDERNESS IN MOUTH AND THROAT WITH OR WITHOUT PRESENCE OF ULCERS  *URINARY PROBLEMS  *BOWEL PROBLEMS  UNUSUAL RASH Items with * indicate a potential emergency and should be followed up as soon as possible.  Feel free to call the clinic you have any questions or concerns. The clinic phone number is [phone].  Please show the CHEMO ALERT CARD at check-in to the Emergency Department and triage nurse.   

## 2015-06-02 LAB — CANCER ANTIGEN 19-9: CA 19-9: 20772 U/mL — ABNORMAL HIGH (ref 0–35)

## 2015-06-12 ENCOUNTER — Other Ambulatory Visit: Payer: Self-pay | Admitting: Oncology

## 2015-06-15 ENCOUNTER — Ambulatory Visit: Payer: Medicare Other | Admitting: Nutrition

## 2015-06-15 ENCOUNTER — Ambulatory Visit: Payer: Medicare Other

## 2015-06-15 ENCOUNTER — Telehealth: Payer: Self-pay | Admitting: Oncology

## 2015-06-15 ENCOUNTER — Other Ambulatory Visit (HOSPITAL_BASED_OUTPATIENT_CLINIC_OR_DEPARTMENT_OTHER): Payer: Medicare Other

## 2015-06-15 ENCOUNTER — Telehealth: Payer: Self-pay | Admitting: *Deleted

## 2015-06-15 ENCOUNTER — Ambulatory Visit (HOSPITAL_BASED_OUTPATIENT_CLINIC_OR_DEPARTMENT_OTHER): Payer: Medicare Other | Admitting: Nurse Practitioner

## 2015-06-15 ENCOUNTER — Ambulatory Visit (HOSPITAL_BASED_OUTPATIENT_CLINIC_OR_DEPARTMENT_OTHER): Payer: Medicare Other

## 2015-06-15 VITALS — BP 119/70 | HR 72 | Temp 97.6°F | Resp 20 | Wt 147.2 lb

## 2015-06-15 DIAGNOSIS — C251 Malignant neoplasm of body of pancreas: Secondary | ICD-10-CM

## 2015-06-15 DIAGNOSIS — R634 Abnormal weight loss: Secondary | ICD-10-CM

## 2015-06-15 DIAGNOSIS — E119 Type 2 diabetes mellitus without complications: Secondary | ICD-10-CM | POA: Diagnosis not present

## 2015-06-15 DIAGNOSIS — C787 Secondary malignant neoplasm of liver and intrahepatic bile duct: Secondary | ICD-10-CM

## 2015-06-15 DIAGNOSIS — Z5111 Encounter for antineoplastic chemotherapy: Secondary | ICD-10-CM | POA: Diagnosis present

## 2015-06-15 LAB — COMPREHENSIVE METABOLIC PANEL
ALT: 26 U/L (ref 0–55)
ANION GAP: 7 meq/L (ref 3–11)
AST: 20 U/L (ref 5–34)
Albumin: 3.1 g/dL — ABNORMAL LOW (ref 3.5–5.0)
Alkaline Phosphatase: 85 U/L (ref 40–150)
BILIRUBIN TOTAL: 0.57 mg/dL (ref 0.20–1.20)
BUN: 23.7 mg/dL (ref 7.0–26.0)
CO2: 26 meq/L (ref 22–29)
Calcium: 9 mg/dL (ref 8.4–10.4)
Chloride: 104 mEq/L (ref 98–109)
Creatinine: 0.9 mg/dL (ref 0.7–1.3)
EGFR: 78 mL/min/{1.73_m2} — AB (ref >90)
Glucose: 343 mg/dl — ABNORMAL HIGH (ref 70–140)
Potassium: 4 mEq/L (ref 3.5–5.1)
Sodium: 137 mEq/L (ref 136–145)
TOTAL PROTEIN: 6 g/dL — AB (ref 6.4–8.3)

## 2015-06-15 LAB — CBC WITH DIFFERENTIAL/PLATELET
BASO%: 1.4 % (ref 0.0–2.0)
BASOS ABS: 0.1 10*3/uL (ref 0.0–0.1)
EOS ABS: 0.2 10*3/uL (ref 0.0–0.5)
EOS%: 2.4 % (ref 0.0–7.0)
HCT: 34.5 % — ABNORMAL LOW (ref 38.4–49.9)
HGB: 11.2 g/dL — ABNORMAL LOW (ref 13.0–17.1)
LYMPH#: 0.8 10*3/uL — AB (ref 0.9–3.3)
LYMPH%: 11 % — ABNORMAL LOW (ref 14.0–49.0)
MCH: 31.4 pg (ref 27.2–33.4)
MCHC: 32.4 g/dL (ref 32.0–36.0)
MCV: 96.8 fL (ref 79.3–98.0)
MONO#: 1.1 10*3/uL — AB (ref 0.1–0.9)
MONO%: 15.1 % — AB (ref 0.0–14.0)
NEUT%: 70.1 % (ref 39.0–75.0)
NEUTROS ABS: 5.1 10*3/uL (ref 1.5–6.5)
PLATELETS: 167 10*3/uL (ref 140–400)
RBC: 3.56 10*6/uL — AB (ref 4.20–5.82)
RDW: 15.1 % — AB (ref 11.0–14.6)
WBC: 7.3 10*3/uL (ref 4.0–10.3)

## 2015-06-15 MED ORDER — SODIUM CHLORIDE 0.9 % IV SOLN
10.0000 mg | Freq: Once | INTRAVENOUS | Status: AC
  Administered 2015-06-15: 10 mg via INTRAVENOUS
  Filled 2015-06-15: qty 1

## 2015-06-15 MED ORDER — SODIUM CHLORIDE 0.9 % IV SOLN
800.0000 mg/m2 | Freq: Once | INTRAVENOUS | Status: AC
  Administered 2015-06-15: 1520 mg via INTRAVENOUS
  Filled 2015-06-15: qty 39.98

## 2015-06-15 MED ORDER — HEPARIN SOD (PORK) LOCK FLUSH 100 UNIT/ML IV SOLN
500.0000 [IU] | Freq: Once | INTRAVENOUS | Status: AC | PRN
  Administered 2015-06-15: 500 [IU]
  Filled 2015-06-15: qty 5

## 2015-06-15 MED ORDER — SODIUM CHLORIDE 0.9 % IJ SOLN
10.0000 mL | INTRAMUSCULAR | Status: DC | PRN
  Administered 2015-06-15: 10 mL
  Filled 2015-06-15: qty 10

## 2015-06-15 MED ORDER — PALONOSETRON HCL INJECTION 0.25 MG/5ML
0.2500 mg | Freq: Once | INTRAVENOUS | Status: AC
  Administered 2015-06-15: 0.25 mg via INTRAVENOUS

## 2015-06-15 MED ORDER — SODIUM CHLORIDE 0.9 % IV SOLN
Freq: Once | INTRAVENOUS | Status: AC
  Administered 2015-06-15: 11:00:00 via INTRAVENOUS

## 2015-06-15 NOTE — Progress Notes (Signed)
  [place] OFFICE PROGRESS NOTE   Diagnosis:  Pancreas cancer  INTERVAL HISTORY:   Mr. Persad returns as scheduled. He completed cycle 4 gemcitabine/Abraxane 06/01/2015. He denies nausea/vomiting. No mouth sores. No diarrhea or constipation. No fever. No rash. He notes that he bruises easily. Neuropathy symptoms have worsened. He is having significant difficulty with buttons.  Objective:  Vital signs in last 24 hours:  Blood pressure 119/70, pulse 72, temperature 97.6 F (36.4 C), temperature source Oral, resp. rate 20, weight 147 lb 4 oz (66.792 kg), SpO2 98 %.    HEENT:  No thrush or ulcers. Resp:  Lungs clear bilaterally. Cardio:  Regular rate and rhythm. GI:  Abdomen soft and nontender. No hepatomegaly. No mass. Vascular:  No leg edema. Neuro:  Vibratory sense moderately decreased over the fingertips per tuning fork exam.  Skin:  No rash. Port-A-Cath without erythema.    Lab Results:  Lab Results  Component Value Date   WBC 7.3 06/15/2015   HGB 11.2* 06/15/2015   HCT 34.5* 06/15/2015   MCV 96.8 06/15/2015   PLT 167 06/15/2015   NEUTROABS 5.1 06/15/2015    Imaging:  No results found.  Medications: I have reviewed the patient's current medications.  Assessment/Plan: 1. Metastatic pancreas cancer  CT abdomen/pelvis 03/24/2015 confirmed a pancreas body mass with involvement of the celiac axis,SMV, and splenic vein. Multiple liver metastases  Ultrasound-guided biopsy of a right liver lesion on 04/06/2014 confirmed metastatic adenocarcinoma  Cycle 1 gemcitabine/Abraxane 04/20/2015  Cycle 2 gemcitabine/Abraxane 05/04/2015  Cycle 3 gemcitabine/Abraxane 05/18/2015  Cycle 4 gemcitabine/Abraxane 06/01/2015  Cycle 5 with gemcitabine alone 06/15/2015(Abraxane held due to progressive neuropathy symptoms)  2. Weight loss secondary to metastatic pancreas cancer versus diabetes  3. Diabetes    Disposition: Mr. Canner has completed 4 cycles of  gemcitabine/Abraxane. Plan to proceed with cycle 5 today as scheduled. He will receive gemcitabine alone due to apparent worsening of neuropathy symptoms. He is scheduled for restaging CT evaluation 06/25/2015. He will return for a follow-up visit and chemotherapy in 2 weeks. He will contact the office in the interim with any problems.    Ned Card ANP/GNP-BC   06/15/2015  10:52 AM

## 2015-06-15 NOTE — Patient Instructions (Signed)
[facility] Discharge Instructions for Patients Receiving Chemotherapy  Today you received the following chemotherapy agents Gemzar.  To help prevent nausea and vomiting after your treatment, we encourage you to take your nausea medication.   If you develop nausea and vomiting that is not controlled by your nausea medication, call the clinic.   BELOW ARE SYMPTOMS THAT SHOULD BE REPORTED IMMEDIATELY:  *FEVER GREATER THAN 100.5 F  *CHILLS WITH OR WITHOUT FEVER  NAUSEA AND VOMITING THAT IS NOT CONTROLLED WITH YOUR NAUSEA MEDICATION  *UNUSUAL SHORTNESS OF BREATH  *UNUSUAL BRUISING OR BLEEDING  TENDERNESS IN MOUTH AND THROAT WITH OR WITHOUT PRESENCE OF ULCERS  *URINARY PROBLEMS  *BOWEL PROBLEMS  UNUSUAL RASH Items with * indicate a potential emergency and should be followed up as soon as possible.  Feel free to call the clinic you have any questions or concerns. The clinic phone number is [phone].  Please show the CHEMO ALERT CARD at check-in to the Emergency Department and triage nurse.   

## 2015-06-15 NOTE — Telephone Encounter (Signed)
Per staff message and POF I have scheduled appts. Advised scheduler of appts. JMW  

## 2015-06-15 NOTE — Telephone Encounter (Signed)
per pof to sch pt appt-sent MW email to sch trmt-pt tog et updated copy of avs b4 leaving °

## 2015-06-15 NOTE — Progress Notes (Signed)
Nutrition follow-up completed with patient during infusion for metastatic pancreas cancer. Weight decreased slightly and documented as 147.25 pounds on March 13, down from 148 pounds January 30. Patient states he has been eating more food more often and therefore is having a difficult time controlling blood sugars. Patient is consuming one Glucerna shake a day, one Glucerna protein bar a day, and 1 protein shake a day in addition to meals and snacks. Noted glucose of 343 today. Patient denies nutrition impact symptoms.  Nutrition diagnosis: Food and nutrition related knowledge deficit improved.  Intervention:  Educated patient to focus on high-protein meals and snacks for improved blood sugar control. Encouraged patient to continue Glucerna oral nutrition supplements. Provided coupons at his request. Teach back method used.  Monitoring, evaluation, goals: Patient will work to continue high-calorie, high-protein diet to minimize weight loss in achieve adequate glycemic control.  Next visit: Monday, April 10, during infusion.  **Disclaimer: This note was dictated with voice recognition software. Similar sounding words can inadvertently be transcribed and this note may contain transcription errors which may not have been corrected upon publication of note.**

## 2015-06-25 ENCOUNTER — Encounter (HOSPITAL_COMMUNITY): Payer: Self-pay

## 2015-06-25 ENCOUNTER — Ambulatory Visit (HOSPITAL_COMMUNITY)
Admission: RE | Admit: 2015-06-25 | Discharge: 2015-06-25 | Disposition: A | Discharge status: Home / Self Care | Payer: Medicare Other | Source: Ambulatory Visit | Attending: Oncology | Admitting: Oncology

## 2015-06-25 DIAGNOSIS — I714 Abdominal aortic aneurysm, without rupture: Secondary | ICD-10-CM | POA: Diagnosis not present

## 2015-06-25 DIAGNOSIS — R911 Solitary pulmonary nodule: Secondary | ICD-10-CM | POA: Insufficient documentation

## 2015-06-25 DIAGNOSIS — K769 Liver disease, unspecified: Secondary | ICD-10-CM | POA: Insufficient documentation

## 2015-06-25 DIAGNOSIS — C251 Malignant neoplasm of body of pancreas: Secondary | ICD-10-CM | POA: Insufficient documentation

## 2015-06-25 DIAGNOSIS — N400 Enlarged prostate without lower urinary tract symptoms: Secondary | ICD-10-CM | POA: Diagnosis not present

## 2015-06-25 DIAGNOSIS — R935 Abnormal findings on diagnostic imaging of other abdominal regions, including retroperitoneum: Secondary | ICD-10-CM | POA: Diagnosis not present

## 2015-06-25 MED ORDER — IOPAMIDOL (ISOVUE-300) INJECTION 61%
100.0000 mL | Freq: Once | INTRAVENOUS | Status: AC | PRN
  Administered 2015-06-25: 100 mL via INTRAVENOUS

## 2015-06-29 ENCOUNTER — Telehealth: Payer: Self-pay | Admitting: Oncology

## 2015-06-29 ENCOUNTER — Telehealth: Payer: Self-pay | Admitting: *Deleted

## 2015-06-29 ENCOUNTER — Encounter: Payer: Self-pay | Admitting: *Deleted

## 2015-06-29 ENCOUNTER — Ambulatory Visit (HOSPITAL_BASED_OUTPATIENT_CLINIC_OR_DEPARTMENT_OTHER): Payer: Medicare Other | Admitting: Oncology

## 2015-06-29 ENCOUNTER — Ambulatory Visit (HOSPITAL_BASED_OUTPATIENT_CLINIC_OR_DEPARTMENT_OTHER): Payer: Medicare Other

## 2015-06-29 ENCOUNTER — Ambulatory Visit: Payer: Medicare Other

## 2015-06-29 ENCOUNTER — Other Ambulatory Visit (HOSPITAL_BASED_OUTPATIENT_CLINIC_OR_DEPARTMENT_OTHER): Payer: Medicare Other

## 2015-06-29 VITALS — BP 108/68 | HR 67 | Temp 97.6°F | Resp 18 | Ht 72.0 in | Wt 147.0 lb

## 2015-06-29 DIAGNOSIS — E119 Type 2 diabetes mellitus without complications: Secondary | ICD-10-CM

## 2015-06-29 DIAGNOSIS — Z95828 Presence of other vascular implants and grafts: Secondary | ICD-10-CM

## 2015-06-29 DIAGNOSIS — C787 Secondary malignant neoplasm of liver and intrahepatic bile duct: Secondary | ICD-10-CM | POA: Diagnosis not present

## 2015-06-29 DIAGNOSIS — Z5111 Encounter for antineoplastic chemotherapy: Secondary | ICD-10-CM

## 2015-06-29 DIAGNOSIS — C251 Malignant neoplasm of body of pancreas: Secondary | ICD-10-CM

## 2015-06-29 DIAGNOSIS — R634 Abnormal weight loss: Secondary | ICD-10-CM | POA: Diagnosis not present

## 2015-06-29 LAB — CBC WITH DIFFERENTIAL/PLATELET
BASO%: 1.1 % (ref 0.0–2.0)
Basophils Absolute: 0.1 10*3/uL (ref 0.0–0.1)
EOS ABS: 0.2 10*3/uL (ref 0.0–0.5)
EOS%: 2.8 % (ref 0.0–7.0)
HCT: 33.1 % — ABNORMAL LOW (ref 38.4–49.9)
HGB: 10.9 g/dL — ABNORMAL LOW (ref 13.0–17.1)
LYMPH%: 10 % — AB (ref 14.0–49.0)
MCH: 32 pg (ref 27.2–33.4)
MCHC: 32.9 g/dL (ref 32.0–36.0)
MCV: 97.1 fL (ref 79.3–98.0)
MONO#: 0.9 10*3/uL (ref 0.1–0.9)
MONO%: 12.3 % (ref 0.0–14.0)
NEUT#: 5.7 10*3/uL (ref 1.5–6.5)
NEUT%: 73.8 % (ref 39.0–75.0)
PLATELETS: 130 10*3/uL — AB (ref 140–400)
RBC: 3.41 10*6/uL — AB (ref 4.20–5.82)
RDW: 15.3 % — ABNORMAL HIGH (ref 11.0–14.6)
WBC: 7.7 10*3/uL (ref 4.0–10.3)
lymph#: 0.8 10*3/uL — ABNORMAL LOW (ref 0.9–3.3)

## 2015-06-29 LAB — COMPREHENSIVE METABOLIC PANEL
ALT: 41 U/L (ref 0–55)
ANION GAP: 7 meq/L (ref 3–11)
AST: 30 U/L (ref 5–34)
Albumin: 3 g/dL — ABNORMAL LOW (ref 3.5–5.0)
Alkaline Phosphatase: 74 U/L (ref 40–150)
BUN: 22.9 mg/dL (ref 7.0–26.0)
CHLORIDE: 102 meq/L (ref 98–109)
CO2: 25 meq/L (ref 22–29)
Calcium: 8.8 mg/dL (ref 8.4–10.4)
Creatinine: 0.9 mg/dL (ref 0.7–1.3)
EGFR: 80 mL/min/{1.73_m2} — AB (ref >90)
Glucose: 345 mg/dl — ABNORMAL HIGH (ref 70–140)
POTASSIUM: 3.9 meq/L (ref 3.5–5.1)
Sodium: 135 mEq/L — ABNORMAL LOW (ref 136–145)
Total Bilirubin: 0.58 mg/dL (ref 0.20–1.20)
Total Protein: 5.6 g/dL — ABNORMAL LOW (ref 6.4–8.3)

## 2015-06-29 MED ORDER — PACLITAXEL PROTEIN-BOUND CHEMO INJECTION 100 MG
100.0000 mg/m2 | Freq: Once | INTRAVENOUS | Status: AC
  Administered 2015-06-29: 200 mg via INTRAVENOUS
  Filled 2015-06-29: qty 40

## 2015-06-29 MED ORDER — SODIUM CHLORIDE 0.9% FLUSH
10.0000 mL | INTRAVENOUS | Status: DC | PRN
  Administered 2015-06-29: 10 mL via INTRAVENOUS
  Filled 2015-06-29: qty 10

## 2015-06-29 MED ORDER — PALONOSETRON HCL INJECTION 0.25 MG/5ML
0.2500 mg | Freq: Once | INTRAVENOUS | Status: AC
  Administered 2015-06-29: 0.25 mg via INTRAVENOUS

## 2015-06-29 MED ORDER — SODIUM CHLORIDE 0.9 % IV SOLN
Freq: Once | INTRAVENOUS | Status: AC
  Administered 2015-06-29: 11:00:00 via INTRAVENOUS

## 2015-06-29 MED ORDER — PALONOSETRON HCL INJECTION 0.25 MG/5ML
INTRAVENOUS | Status: AC
  Filled 2015-06-29: qty 5

## 2015-06-29 MED ORDER — SODIUM CHLORIDE 0.9 % IV SOLN
800.0000 mg/m2 | Freq: Once | INTRAVENOUS | Status: AC
  Administered 2015-06-29: 1520 mg via INTRAVENOUS
  Filled 2015-06-29: qty 39.98

## 2015-06-29 MED ORDER — SODIUM CHLORIDE 0.9 % IV SOLN
10.0000 mg | Freq: Once | INTRAVENOUS | Status: AC
  Administered 2015-06-29: 10 mg via INTRAVENOUS
  Filled 2015-06-29: qty 1

## 2015-06-29 MED ORDER — SODIUM CHLORIDE 0.9 % IJ SOLN
10.0000 mL | INTRAMUSCULAR | Status: DC | PRN
  Administered 2015-06-29: 10 mL
  Filled 2015-06-29: qty 10

## 2015-06-29 MED ORDER — HEPARIN SOD (PORK) LOCK FLUSH 100 UNIT/ML IV SOLN
500.0000 [IU] | Freq: Once | INTRAVENOUS | Status: AC | PRN
  Administered 2015-06-29: 500 [IU]
  Filled 2015-06-29: qty 5

## 2015-06-29 NOTE — Progress Notes (Signed)
Glucose 345 Sodium 135, Per Lorriane Shire RN Per Dr. Benay Spice no new orders at this time.  Per Dr. Benay Spice Pt will Receive both Abraxane and Gemzar today, Pharmacy aware.

## 2015-06-29 NOTE — Progress Notes (Signed)
Oncology Nurse Navigator Documentation  Oncology Nurse Navigator Flowsheets 06/29/2015  Navigator Location [facility]  Navigator Encounter Type Follow-up Appt  Abnormal Finding Date -  Confirmed Diagnosis Date -  Treatment Initiated Date -  Patient Visit Type MedOnc  Treatment Phase Active Tx--MD will resume Abraxane w/Gemzar today  Barriers/Navigation Needs Education  Education Other--tumor marker results; xray reports; labs  Interventions Education Method  Education Method Verbal;Teach-back;Written  Support Groups/Services -  Acuity -  Time Spent with Patient 30  Discussed CT reports w/son today and provided copy-MD feels his disease is stable based on his performance status, decrease in CA 19-9 and that the 1st scan was done almost 1 month before chemo started. The next CT scan will be better to evaluate his response to treatment, but how he feels is a good sign. Edel has been eating well and walking 2-3 miles/day.

## 2015-06-29 NOTE — Telephone Encounter (Signed)
per pof to sch pt appt-sent MW email to sch trmt-pt to get updated copy of avs °

## 2015-06-29 NOTE — Progress Notes (Signed)
  [place] OFFICE PROGRESS NOTE   Diagnosis: Pancreas cancer  INTERVAL HISTORY:   [name] returns as scheduled. He reports a good appetite. No pain. He walked several miles each of the past few days. He was last treated with gemcitabine 06/15/2015. He reports stable neuropathy symptoms that predated chemotherapy. He sometimes has difficulty buttoning his shirt and holding objects.  Objective:  Vital signs in last 24 hours:  Blood pressure 108/68, pulse 67, temperature 97.6 F (36.4 C), temperature source Oral, resp. rate 18, height 6' (1.829 m), weight 147 lb (66.679 kg), SpO2 99 %.    HEENT: No thrush or ulcers Resp: Lungs clear bilaterally Cardio: Regular rate and rhythm GI: No hepatomegaly, no mass, nontender Vascular: No leg edema Neuro: Moderate decrease in vibratory sense at the fingertips bilaterally    Portacath/PICC-without erythema  Lab Results:  Lab Results  Component Value Date   WBC 7.7 06/29/2015   HGB 10.9* 06/29/2015   HCT 33.1* 06/29/2015   MCV 97.1 06/29/2015   PLT 130* 06/29/2015   NEUTROABS 5.7 06/29/2015    CA 19-9 on 06/01/2015: 20,772  Imaging: CT abdomen/pelvis 06/25/2015, compared to 03/24/2015-increased nodular component of a 6 mm right middle lobe lesion, slight increase in size of several liver lesions, stable pancreas mass, slight decrease in gastrohepatic node CT images reviewed Medications: I have reviewed the patient's current medications.  Assessment/Plan: 1. Metastatic pancreas cancer  CT abdomen/pelvis 03/24/2015 confirmed a pancreas body mass with involvement of the celiac axis,SMV, and splenic vein. Multiple liver metastases  Ultrasound-guided biopsy of a right liver lesion on 04/06/2014 confirmed metastatic adenocarcinoma  Cycle 1 gemcitabine/Abraxane 04/20/2015  Cycle 2 gemcitabine/Abraxane 05/04/2015  Cycle 3 gemcitabine/Abraxane 05/18/2015  Cycle 4 gemcitabine/Abraxane 06/01/2015  Cycle 5 with  gemcitabine alone 06/15/2015(Abraxane held due to progressive neuropathy symptoms)  Restaging CT 06/25/2015-slight enlargement of liver lesions, stable pancreas mass, slight decrease in the size of a gastrohepatic node  Cycle 6 gemcitabine/Abraxane 06/29/2015  2. Weight loss secondary to metastatic pancreas cancer versus diabetes  3. Diabetes     Disposition:  [name] appears stable. The CA 19-9 is stable since beginning chemotherapy. I reviewed the restaging CT findings with [name] and his son. There is been a slight increase in the size of several liver lesions, though there was an approximate one-month interval between the baseline CT and initiation of chemotherapy.  We decided to continue gemcitabine/Abraxane with close clinical follow-up. He will complete another treatment with gemcitabine/Abraxane today. [name] will return for an office visit and chemotherapy in 2 weeks.  Betsy Coder, MD  06/29/2015  10:42 AM

## 2015-06-29 NOTE — Telephone Encounter (Signed)
Per staff message and POF I have scheduled appts. Advised scheduler of appts. JMW  

## 2015-06-29 NOTE — Patient Instructions (Signed)
[facility] Discharge Instructions for Patients Receiving Chemotherapy  Today you received the following chemotherapy agents: Abraxane and Gemzar   To help prevent nausea and vomiting after your treatment, we encourage you to take your nausea medication as directed.    If you develop nausea and vomiting that is not controlled by your nausea medication, call the clinic.   BELOW ARE SYMPTOMS THAT SHOULD BE REPORTED IMMEDIATELY:  *FEVER GREATER THAN 100.5 F  *CHILLS WITH OR WITHOUT FEVER  NAUSEA AND VOMITING THAT IS NOT CONTROLLED WITH YOUR NAUSEA MEDICATION  *UNUSUAL SHORTNESS OF BREATH  *UNUSUAL BRUISING OR BLEEDING  TENDERNESS IN MOUTH AND THROAT WITH OR WITHOUT PRESENCE OF ULCERS  *URINARY PROBLEMS  *BOWEL PROBLEMS  UNUSUAL RASH Items with * indicate a potential emergency and should be followed up as soon as possible.  Feel free to call the clinic you have any questions or concerns. The clinic phone number is [phone].  Please show the CHEMO ALERT CARD at check-in to the Emergency Department and triage nurse.   

## 2015-06-29 NOTE — Patient Instructions (Signed)

## 2015-07-01 LAB — CANCER ANTIGEN 19-9

## 2015-07-12 ENCOUNTER — Other Ambulatory Visit: Payer: Self-pay | Admitting: Oncology

## 2015-07-13 ENCOUNTER — Other Ambulatory Visit (HOSPITAL_BASED_OUTPATIENT_CLINIC_OR_DEPARTMENT_OTHER): Payer: Medicare Other

## 2015-07-13 ENCOUNTER — Ambulatory Visit: Payer: Medicare Other

## 2015-07-13 ENCOUNTER — Ambulatory Visit: Payer: Medicare Other | Admitting: Nutrition

## 2015-07-13 ENCOUNTER — Ambulatory Visit (HOSPITAL_BASED_OUTPATIENT_CLINIC_OR_DEPARTMENT_OTHER): Payer: Medicare Other | Admitting: Oncology

## 2015-07-13 ENCOUNTER — Other Ambulatory Visit: Payer: Medicare Other

## 2015-07-13 ENCOUNTER — Telehealth: Payer: Self-pay | Admitting: Oncology

## 2015-07-13 ENCOUNTER — Ambulatory Visit (HOSPITAL_BASED_OUTPATIENT_CLINIC_OR_DEPARTMENT_OTHER): Payer: Medicare Other

## 2015-07-13 VITALS — BP 106/67 | HR 67 | Temp 98.0°F | Resp 18 | Ht 72.0 in | Wt 147.4 lb

## 2015-07-13 DIAGNOSIS — C787 Secondary malignant neoplasm of liver and intrahepatic bile duct: Secondary | ICD-10-CM

## 2015-07-13 DIAGNOSIS — G629 Polyneuropathy, unspecified: Secondary | ICD-10-CM | POA: Diagnosis not present

## 2015-07-13 DIAGNOSIS — C251 Malignant neoplasm of body of pancreas: Secondary | ICD-10-CM

## 2015-07-13 DIAGNOSIS — Z5111 Encounter for antineoplastic chemotherapy: Secondary | ICD-10-CM | POA: Diagnosis present

## 2015-07-13 DIAGNOSIS — E119 Type 2 diabetes mellitus without complications: Secondary | ICD-10-CM | POA: Diagnosis not present

## 2015-07-13 DIAGNOSIS — Z95828 Presence of other vascular implants and grafts: Secondary | ICD-10-CM

## 2015-07-13 LAB — CBC WITH DIFFERENTIAL/PLATELET
BASO%: 0.3 % (ref 0.0–2.0)
BASOS ABS: 0 10*3/uL (ref 0.0–0.1)
EOS ABS: 0.2 10*3/uL (ref 0.0–0.5)
EOS%: 3 % (ref 0.0–7.0)
HEMATOCRIT: 32.4 % — AB (ref 38.4–49.9)
HEMOGLOBIN: 10.5 g/dL — AB (ref 13.0–17.1)
LYMPH#: 1 10*3/uL (ref 0.9–3.3)
LYMPH%: 15.7 % (ref 14.0–49.0)
MCH: 32.1 pg (ref 27.2–33.4)
MCHC: 32.4 g/dL (ref 32.0–36.0)
MCV: 99.1 fL — AB (ref 79.3–98.0)
MONO#: 0.9 10*3/uL (ref 0.1–0.9)
MONO%: 14.4 % — ABNORMAL HIGH (ref 0.0–14.0)
NEUT#: 4.2 10*3/uL (ref 1.5–6.5)
NEUT%: 66.6 % (ref 39.0–75.0)
PLATELETS: 128 10*3/uL — AB (ref 140–400)
RBC: 3.27 10*6/uL — ABNORMAL LOW (ref 4.20–5.82)
RDW: 15.1 % — AB (ref 11.0–14.6)
WBC: 6.3 10*3/uL (ref 4.0–10.3)

## 2015-07-13 LAB — COMPREHENSIVE METABOLIC PANEL
ALBUMIN: 3.1 g/dL — AB (ref 3.5–5.0)
ALK PHOS: 68 U/L (ref 40–150)
ALT: 32 U/L (ref 0–55)
ANION GAP: 8 meq/L (ref 3–11)
AST: 23 U/L (ref 5–34)
BUN: 21.3 mg/dL (ref 7.0–26.0)
CALCIUM: 9 mg/dL (ref 8.4–10.4)
CHLORIDE: 105 meq/L (ref 98–109)
CO2: 25 mEq/L (ref 22–29)
Creatinine: 0.9 mg/dL (ref 0.7–1.3)
EGFR: 80 mL/min/{1.73_m2} — AB (ref >90)
Glucose: 274 mg/dl — ABNORMAL HIGH (ref 70–140)
POTASSIUM: 4 meq/L (ref 3.5–5.1)
Sodium: 137 mEq/L (ref 136–145)
Total Bilirubin: 0.64 mg/dL (ref 0.20–1.20)
Total Protein: 5.5 g/dL — ABNORMAL LOW (ref 6.4–8.3)

## 2015-07-13 MED ORDER — SODIUM CHLORIDE 0.9 % IV SOLN
Freq: Once | INTRAVENOUS | Status: AC
  Administered 2015-07-13: 09:00:00 via INTRAVENOUS

## 2015-07-13 MED ORDER — PACLITAXEL PROTEIN-BOUND CHEMO INJECTION 100 MG
100.0000 mg/m2 | Freq: Once | INTRAVENOUS | Status: AC
  Administered 2015-07-13: 200 mg via INTRAVENOUS
  Filled 2015-07-13: qty 40

## 2015-07-13 MED ORDER — SODIUM CHLORIDE 0.9% FLUSH
10.0000 mL | INTRAVENOUS | Status: DC | PRN
  Administered 2015-07-13: 10 mL via INTRAVENOUS
  Filled 2015-07-13: qty 10

## 2015-07-13 MED ORDER — SODIUM CHLORIDE 0.9 % IV SOLN
800.0000 mg/m2 | Freq: Once | INTRAVENOUS | Status: AC
  Administered 2015-07-13: 1520 mg via INTRAVENOUS
  Filled 2015-07-13: qty 39.98

## 2015-07-13 MED ORDER — SODIUM CHLORIDE 0.9 % IV SOLN
10.0000 mg | Freq: Once | INTRAVENOUS | Status: AC
  Administered 2015-07-13: 10 mg via INTRAVENOUS
  Filled 2015-07-13: qty 1

## 2015-07-13 MED ORDER — SODIUM CHLORIDE 0.9 % IJ SOLN
10.0000 mL | INTRAMUSCULAR | Status: DC | PRN
  Administered 2015-07-13: 10 mL
  Filled 2015-07-13: qty 10

## 2015-07-13 MED ORDER — PALONOSETRON HCL INJECTION 0.25 MG/5ML
0.2500 mg | Freq: Once | INTRAVENOUS | Status: AC
  Administered 2015-07-13: 0.25 mg via INTRAVENOUS

## 2015-07-13 MED ORDER — HEPARIN SOD (PORK) LOCK FLUSH 100 UNIT/ML IV SOLN
500.0000 [IU] | Freq: Once | INTRAVENOUS | Status: AC | PRN
  Administered 2015-07-13: 500 [IU]
  Filled 2015-07-13: qty 5

## 2015-07-13 NOTE — Patient Instructions (Signed)
[facility] Discharge Instructions for Patients Receiving Chemotherapy  Today you received the following chemotherapy agents Abraxane/Gemzar  To help prevent nausea and vomiting after your treatment, we encourage you to take your nausea medication    If you develop nausea and vomiting that is not controlled by your nausea medication, call the clinic.   BELOW ARE SYMPTOMS THAT SHOULD BE REPORTED IMMEDIATELY:  *FEVER GREATER THAN 100.5 F  *CHILLS WITH OR WITHOUT FEVER  NAUSEA AND VOMITING THAT IS NOT CONTROLLED WITH YOUR NAUSEA MEDICATION  *UNUSUAL SHORTNESS OF BREATH  *UNUSUAL BRUISING OR BLEEDING  TENDERNESS IN MOUTH AND THROAT WITH OR WITHOUT PRESENCE OF ULCERS  *URINARY PROBLEMS  *BOWEL PROBLEMS  UNUSUAL RASH Items with * indicate a potential emergency and should be followed up as soon as possible.  Feel free to call the clinic you have any questions or concerns. The clinic phone number is [phone].  Please show the CHEMO ALERT CARD at check-in to the Emergency Department and triage nurse.   

## 2015-07-13 NOTE — Progress Notes (Signed)
  [name] OFFICE PROGRESS NOTE   Diagnosis: Pancreas cancer  INTERVAL HISTORY:   [name] returns as scheduled. He completed another treatment with gemcitabine/Abraxane 06/29/2015. He tolerated the treatment well. No change in neuropathy symptoms. He walks 2 miles yesterday. Good appetite.  Objective:  Vital signs in last 24 hours:  Blood pressure 106/67, pulse 67, temperature 98 F (36.7 C), temperature source Oral, resp. rate 18, height 6' (1.829 m), weight 147 lb 6.4 oz (66.86 kg), SpO2 98 %.    HEENT: No thrush or ulcers Resp: Lungs clear bilaterally Cardio: Regular rate and rhythm, distant heart sounds GI: No hepatomegaly, nontender Vascular: No leg edema  Portacath/PICC-without erythema  Lab Results:  Lab Results  Component Value Date   WBC 6.3 07/13/2015   HGB 10.5* 07/13/2015   HCT 32.4* 07/13/2015   MCV 99.1* 07/13/2015   PLT 128* 07/13/2015   NEUTROABS 4.2 07/13/2015   06/29/2015: CA 19-9-  29,965 Medications: I have reviewed the patient's current medications.  Assessment/Plan: 1. Metastatic pancreas cancer  CT abdomen/pelvis 03/24/2015 confirmed a pancreas body mass with involvement of the celiac axis,SMV, and splenic vein. Multiple liver metastases  Ultrasound-guided biopsy of a right liver lesion on 04/06/2014 confirmed metastatic adenocarcinoma  Cycle 1 gemcitabine/Abraxane 04/20/2015  Cycle 2 gemcitabine/Abraxane 05/04/2015  Cycle 3 gemcitabine/Abraxane 05/18/2015  Cycle 4 gemcitabine/Abraxane 06/01/2015  Cycle 5 with gemcitabine alone 06/15/2015(Abraxane held due to progressive neuropathy symptoms)  Restaging CT 06/25/2015-slight enlargement of liver lesions, stable pancreas mass, slight decrease in the size of a gastrohepatic node  Cycle 6 gemcitabine/Abraxane 06/29/2015  Cycle 7 gemcitabine/Abraxane 07/13/2015  2. Weight loss secondary to metastatic pancreas cancer versus diabetes  3. Diabetes  4.   Peripheral  neuropathy-predating chemotherapy    Disposition:  [name] appears stable. The plan is to continue gemcitabine/Abraxane. He will return for an office visit and chemotherapy in 2 weeks.  Betsy Coder, MD  07/13/2015  8:33 AM

## 2015-07-13 NOTE — Patient Instructions (Signed)

## 2015-07-13 NOTE — Telephone Encounter (Signed)
Gave patient avs report and appointments for April and May.  °

## 2015-07-13 NOTE — Progress Notes (Signed)
Nutrition follow-up completed with patient during infusion for metastatic pancreas cancer. Weight stable and documented as 147.4 pounds. Noted glucose of 274, and albumin 3.1. Patient continues to consume Glucerna shake or Glucerna protein bars as needed. Patient has a good appetite and is walking a regular basis.  Nutrition diagnosis: Food and nutrition related knowledge deficit resolved.  Encouraged patient to focus on high-protein meals and snacks and weight management. Provided Glucerna coupons for patient at his request. Teach back method used I will see patient on Monday, April 24 to provide additional coupons.  **Disclaimer: This note was dictated with voice recognition software. Similar sounding words can inadvertently be transcribed and this note may contain transcription errors which may not have been corrected upon publication of note.**

## 2015-07-14 LAB — CANCER ANTIGEN 19-9: CA 19-9: 32051 U/mL — ABNORMAL HIGH (ref 0–35)

## 2015-07-26 ENCOUNTER — Other Ambulatory Visit: Payer: Self-pay | Admitting: Oncology

## 2015-07-27 ENCOUNTER — Ambulatory Visit (HOSPITAL_BASED_OUTPATIENT_CLINIC_OR_DEPARTMENT_OTHER): Payer: Medicare Other

## 2015-07-27 ENCOUNTER — Ambulatory Visit: Payer: Medicare Other | Admitting: Nutrition

## 2015-07-27 ENCOUNTER — Ambulatory Visit (HOSPITAL_BASED_OUTPATIENT_CLINIC_OR_DEPARTMENT_OTHER): Payer: Medicare Other | Admitting: Nurse Practitioner

## 2015-07-27 ENCOUNTER — Telehealth: Payer: Self-pay | Admitting: Oncology

## 2015-07-27 ENCOUNTER — Other Ambulatory Visit (HOSPITAL_BASED_OUTPATIENT_CLINIC_OR_DEPARTMENT_OTHER): Payer: Medicare Other

## 2015-07-27 ENCOUNTER — Ambulatory Visit: Payer: Medicare Other

## 2015-07-27 VITALS — BP 95/59 | HR 68 | Temp 97.5°F | Resp 18 | Ht 72.0 in | Wt 145.7 lb

## 2015-07-27 VITALS — BP 102/62

## 2015-07-27 DIAGNOSIS — C251 Malignant neoplasm of body of pancreas: Secondary | ICD-10-CM

## 2015-07-27 DIAGNOSIS — Z95828 Presence of other vascular implants and grafts: Secondary | ICD-10-CM

## 2015-07-27 DIAGNOSIS — Z5111 Encounter for antineoplastic chemotherapy: Secondary | ICD-10-CM

## 2015-07-27 DIAGNOSIS — G629 Polyneuropathy, unspecified: Secondary | ICD-10-CM | POA: Diagnosis not present

## 2015-07-27 DIAGNOSIS — E119 Type 2 diabetes mellitus without complications: Secondary | ICD-10-CM | POA: Diagnosis not present

## 2015-07-27 DIAGNOSIS — Z452 Encounter for adjustment and management of vascular access device: Secondary | ICD-10-CM | POA: Diagnosis not present

## 2015-07-27 DIAGNOSIS — C787 Secondary malignant neoplasm of liver and intrahepatic bile duct: Secondary | ICD-10-CM | POA: Diagnosis not present

## 2015-07-27 DIAGNOSIS — R634 Abnormal weight loss: Secondary | ICD-10-CM | POA: Diagnosis not present

## 2015-07-27 LAB — CBC WITH DIFFERENTIAL/PLATELET
BASO%: 0.3 % (ref 0.0–2.0)
BASOS ABS: 0 10*3/uL (ref 0.0–0.1)
EOS%: 1.6 % (ref 0.0–7.0)
Eosinophils Absolute: 0.1 10*3/uL (ref 0.0–0.5)
HEMATOCRIT: 35.2 % — AB (ref 38.4–49.9)
HGB: 11.3 g/dL — ABNORMAL LOW (ref 13.0–17.1)
LYMPH#: 1 10*3/uL (ref 0.9–3.3)
LYMPH%: 13.9 % — AB (ref 14.0–49.0)
MCH: 32.1 pg (ref 27.2–33.4)
MCHC: 32.1 g/dL (ref 32.0–36.0)
MCV: 100 fL — ABNORMAL HIGH (ref 79.3–98.0)
MONO#: 1 10*3/uL — AB (ref 0.1–0.9)
MONO%: 13.9 % (ref 0.0–14.0)
NEUT#: 5.2 10*3/uL (ref 1.5–6.5)
NEUT%: 70.3 % (ref 39.0–75.0)
PLATELETS: 142 10*3/uL (ref 140–400)
RBC: 3.52 10*6/uL — AB (ref 4.20–5.82)
RDW: 15.2 % — ABNORMAL HIGH (ref 11.0–14.6)
WBC: 7.3 10*3/uL (ref 4.0–10.3)

## 2015-07-27 LAB — COMPREHENSIVE METABOLIC PANEL
ALT: 32 U/L (ref 0–55)
ANION GAP: 7 meq/L (ref 3–11)
AST: 26 U/L (ref 5–34)
Albumin: 3.5 g/dL (ref 3.5–5.0)
Alkaline Phosphatase: 69 U/L (ref 40–150)
BUN: 21.3 mg/dL (ref 7.0–26.0)
CHLORIDE: 102 meq/L (ref 98–109)
CO2: 27 meq/L (ref 22–29)
CREATININE: 0.9 mg/dL (ref 0.7–1.3)
Calcium: 9.3 mg/dL (ref 8.4–10.4)
EGFR: 77 mL/min/{1.73_m2} — ABNORMAL LOW (ref >90)
Glucose: 311 mg/dl — ABNORMAL HIGH (ref 70–140)
POTASSIUM: 3.9 meq/L (ref 3.5–5.1)
Sodium: 136 mEq/L (ref 136–145)
Total Bilirubin: 0.74 mg/dL (ref 0.20–1.20)
Total Protein: 6.1 g/dL — ABNORMAL LOW (ref 6.4–8.3)

## 2015-07-27 MED ORDER — SODIUM CHLORIDE 0.9 % IV SOLN
Freq: Once | INTRAVENOUS | Status: AC
  Administered 2015-07-27: 11:00:00 via INTRAVENOUS

## 2015-07-27 MED ORDER — SODIUM CHLORIDE 0.9 % IV SOLN
800.0000 mg/m2 | Freq: Once | INTRAVENOUS | Status: AC
  Administered 2015-07-27: 1520 mg via INTRAVENOUS
  Filled 2015-07-27: qty 39.98

## 2015-07-27 MED ORDER — SODIUM CHLORIDE 0.9 % IJ SOLN
10.0000 mL | INTRAMUSCULAR | Status: DC | PRN
  Administered 2015-07-27: 10 mL
  Filled 2015-07-27: qty 10

## 2015-07-27 MED ORDER — HEPARIN SOD (PORK) LOCK FLUSH 100 UNIT/ML IV SOLN
500.0000 [IU] | Freq: Once | INTRAVENOUS | Status: AC | PRN
  Administered 2015-07-27: 500 [IU]
  Filled 2015-07-27: qty 5

## 2015-07-27 MED ORDER — SODIUM CHLORIDE 0.9% FLUSH
10.0000 mL | INTRAVENOUS | Status: DC | PRN
  Administered 2015-07-27: 10 mL via INTRAVENOUS
  Filled 2015-07-27: qty 10

## 2015-07-27 MED ORDER — PALONOSETRON HCL INJECTION 0.25 MG/5ML
INTRAVENOUS | Status: AC
Start: 2015-07-27 — End: 2015-07-27
  Filled 2015-07-27: qty 5

## 2015-07-27 MED ORDER — PACLITAXEL PROTEIN-BOUND CHEMO INJECTION 100 MG
100.0000 mg/m2 | Freq: Once | INTRAVENOUS | Status: AC
  Administered 2015-07-27: 200 mg via INTRAVENOUS
  Filled 2015-07-27: qty 40

## 2015-07-27 MED ORDER — SODIUM CHLORIDE 0.9 % IV SOLN
10.0000 mg | Freq: Once | INTRAVENOUS | Status: AC
  Administered 2015-07-27: 10 mg via INTRAVENOUS
  Filled 2015-07-27: qty 1

## 2015-07-27 MED ORDER — PALONOSETRON HCL INJECTION 0.25 MG/5ML
0.2500 mg | Freq: Once | INTRAVENOUS | Status: AC
  Administered 2015-07-27: 0.25 mg via INTRAVENOUS

## 2015-07-27 MED ORDER — ALTEPLASE 2 MG IJ SOLR
2.0000 mg | Freq: Once | INTRAMUSCULAR | Status: AC | PRN
  Administered 2015-07-27: 2 mg
  Filled 2015-07-27: qty 2

## 2015-07-27 NOTE — Telephone Encounter (Signed)
Gave pt appt & avs °

## 2015-07-27 NOTE — Progress Notes (Signed)
Patient in for [facility] access and labs today. PAC accessed and flushed without any difficulty. Less than 2 mL of blood return noted with flush, not enough to obtain labs. Patient repositioned and coughed, but still less than 2 mL of blood return noted. Patient denies any pain or discomfort during or after flush. Patient agreed to have all labs drawn by the phlebotomist today. All labs drawn by Oliver Hum, Phlebotomist. Ann Lions, RN Infusion Charge Nurse made aware. Call placed to [place], RN Desk Nurse for Ned Card, NP, but no answer. Patient's PAC covered with a Tegaderm dressing, and Patient discharged from the Flush Room without any complaints.

## 2015-07-27 NOTE — Progress Notes (Signed)
Nutrition follow-up completed with patient during infusion for metastatic pancreas cancer. Weight has decreased slightly and was documented as 145.7 pounds on April 24. Patient continues to consume Glucerna shake or Glucerna protein bars. Patient states blood sugars are controlled. Patient reports he feels well.  There is no new nutrition diagnosis  Provided patient with additional Glucerna coupons to purchase his nutrition supplements. Enforced small frequent meals and snacks. Will follow-up with patient to provide additional coupons as needed on Monday, May 22.

## 2015-07-27 NOTE — Patient Instructions (Signed)

## 2015-07-27 NOTE — Patient Instructions (Signed)
[facility] Discharge Instructions for Patients Receiving Chemotherapy  Today you received the following chemotherapy agents:  Abraxane and Gemzar.  To help prevent nausea and vomiting after your treatment, we encourage you to take your nausea medication as prescribed.   If you develop nausea and vomiting that is not controlled by your nausea medication, call the clinic.   BELOW ARE SYMPTOMS THAT SHOULD BE REPORTED IMMEDIATELY:  *FEVER GREATER THAN 100.5 F  *CHILLS WITH OR WITHOUT FEVER  NAUSEA AND VOMITING THAT IS NOT CONTROLLED WITH YOUR NAUSEA MEDICATION  *UNUSUAL SHORTNESS OF BREATH  *UNUSUAL BRUISING OR BLEEDING  TENDERNESS IN MOUTH AND THROAT WITH OR WITHOUT PRESENCE OF ULCERS  *URINARY PROBLEMS  *BOWEL PROBLEMS  UNUSUAL RASH Items with * indicate a potential emergency and should be followed up as soon as possible.  Feel free to call the clinic you have any questions or concerns. The clinic phone number is [phone].  Please show the CHEMO ALERT CARD at check-in to the Emergency Department and triage nurse.   

## 2015-07-27 NOTE — Progress Notes (Signed)
  [place] OFFICE PROGRESS NOTE   Diagnosis: Pancreas cancer   INTERVAL HISTORY:   Mr. Biggio returns as scheduled. He completed cycle 7 gemcitabine/Abraxane 07/13/2015. He denies nausea/vomiting. No mouth sores. No diarrhea. No fever following treatment. Stable neuropathy symptoms. He continues to have a good appetite. He notes a decline in his energy level.  We discussed that his blood pressure was low when checked earlier. He reports it was normal at home this morning. He has taken his blood pressure medication.  Objective:  Vital signs in last 24 hours:  Blood pressure 95/59, pulse 68, temperature 97.5 F (36.4 C), temperature source Oral, resp. rate 18, height 6' (1.829 m), weight 145 lb 11.2 oz (66.089 kg), SpO2 99 %.    HEENT: No thrush or ulcers. Resp: Lungs clear bilaterally. Cardio: Regular rate and rhythm. Distant heart sounds. GI: Abdomen soft and nontender. No hepatomegaly. Vascular: No leg edema. Port-A-Cath without erythema.  Lab Results:  Lab Results  Component Value Date   WBC 7.3 07/27/2015   HGB 11.3* 07/27/2015   HCT 35.2* 07/27/2015   MCV 100.0* 07/27/2015   PLT 142 07/27/2015   NEUTROABS 5.2 07/27/2015    Imaging:  No results found.  Medications: I have reviewed the patient's current medications.  Assessment/Plan: 1. Metastatic pancreas cancer  CT abdomen/pelvis 03/24/2015 confirmed a pancreas body mass with involvement of the celiac axis,SMV, and splenic vein. Multiple liver metastases  Ultrasound-guided biopsy of a right liver lesion on 04/06/2014 confirmed metastatic adenocarcinoma  Cycle 1 gemcitabine/Abraxane 04/20/2015  Cycle 2 gemcitabine/Abraxane 05/04/2015  Cycle 3 gemcitabine/Abraxane 05/18/2015  Cycle 4 gemcitabine/Abraxane 06/01/2015  Cycle 5 with gemcitabine alone 06/15/2015(Abraxane held due to progressive neuropathy symptoms)  Restaging CT 06/25/2015-slight enlargement of liver lesions, stable pancreas  mass, slight decrease in the size of a gastrohepatic node  Cycle 6 gemcitabine/Abraxane 06/29/2015  Cycle 7 gemcitabine/Abraxane 07/13/2015  Cycle 8 gemcitabine/Abraxane 07/27/2015  2. Weight loss secondary to metastatic pancreas cancer versus diabetes  3. Diabetes  4. Peripheral neuropathy-predating chemotherapy    Disposition: Mr. Hassebrock appears stable. He has completed 7 cycles of gemcitabine/Abraxane. Plan to proceed with cycle 8 today as scheduled. He will return for a follow-up visit and cycle 9 in 2 weeks. He will contact the office in the interim with any problems.  Blood pressure was mildly decreased. We will repeat in the infusion area.    Ned Card ANP/GNP-BC   07/27/2015  10:10 AM

## 2015-07-28 LAB — CANCER ANTIGEN 19-9

## 2015-08-08 ENCOUNTER — Other Ambulatory Visit: Payer: Self-pay | Admitting: Oncology

## 2015-08-10 ENCOUNTER — Telehealth: Payer: Self-pay | Admitting: Oncology

## 2015-08-10 ENCOUNTER — Ambulatory Visit (HOSPITAL_BASED_OUTPATIENT_CLINIC_OR_DEPARTMENT_OTHER): Payer: Medicare Other

## 2015-08-10 ENCOUNTER — Ambulatory Visit: Payer: Medicare Other

## 2015-08-10 ENCOUNTER — Ambulatory Visit (HOSPITAL_BASED_OUTPATIENT_CLINIC_OR_DEPARTMENT_OTHER): Payer: Medicare Other | Admitting: Nurse Practitioner

## 2015-08-10 ENCOUNTER — Other Ambulatory Visit (HOSPITAL_BASED_OUTPATIENT_CLINIC_OR_DEPARTMENT_OTHER): Payer: Medicare Other

## 2015-08-10 VITALS — BP 125/54 | HR 72 | Temp 97.8°F | Resp 17 | Ht 72.0 in | Wt 146.8 lb

## 2015-08-10 DIAGNOSIS — C251 Malignant neoplasm of body of pancreas: Secondary | ICD-10-CM

## 2015-08-10 DIAGNOSIS — Z95828 Presence of other vascular implants and grafts: Secondary | ICD-10-CM

## 2015-08-10 DIAGNOSIS — Z5111 Encounter for antineoplastic chemotherapy: Secondary | ICD-10-CM | POA: Diagnosis present

## 2015-08-10 DIAGNOSIS — G629 Polyneuropathy, unspecified: Secondary | ICD-10-CM | POA: Diagnosis not present

## 2015-08-10 DIAGNOSIS — R634 Abnormal weight loss: Secondary | ICD-10-CM | POA: Diagnosis not present

## 2015-08-10 DIAGNOSIS — E119 Type 2 diabetes mellitus without complications: Secondary | ICD-10-CM

## 2015-08-10 DIAGNOSIS — C787 Secondary malignant neoplasm of liver and intrahepatic bile duct: Secondary | ICD-10-CM

## 2015-08-10 LAB — COMPREHENSIVE METABOLIC PANEL
ALT: 39 U/L (ref 0–55)
AST: 27 U/L (ref 5–34)
Albumin: 3.3 g/dL — ABNORMAL LOW (ref 3.5–5.0)
Alkaline Phosphatase: 68 U/L (ref 40–150)
Anion Gap: 5 mEq/L (ref 3–11)
BILIRUBIN TOTAL: 0.53 mg/dL (ref 0.20–1.20)
BUN: 16.6 mg/dL (ref 7.0–26.0)
CO2: 27 meq/L (ref 22–29)
Calcium: 9 mg/dL (ref 8.4–10.4)
Chloride: 103 mEq/L (ref 98–109)
Creatinine: 0.9 mg/dL (ref 0.7–1.3)
EGFR: 82 mL/min/{1.73_m2} — AB (ref >90)
Glucose: 309 mg/dl — ABNORMAL HIGH (ref 70–140)
Potassium: 4 mEq/L (ref 3.5–5.1)
SODIUM: 135 meq/L — AB (ref 136–145)
TOTAL PROTEIN: 5.8 g/dL — AB (ref 6.4–8.3)

## 2015-08-10 LAB — CBC WITH DIFFERENTIAL/PLATELET
BASO%: 0.3 % (ref 0.0–2.0)
Basophils Absolute: 0 10*3/uL (ref 0.0–0.1)
EOS%: 1.5 % (ref 0.0–7.0)
Eosinophils Absolute: 0.1 10*3/uL (ref 0.0–0.5)
HCT: 33.4 % — ABNORMAL LOW (ref 38.4–49.9)
HEMOGLOBIN: 10.8 g/dL — AB (ref 13.0–17.1)
LYMPH%: 14.1 % (ref 14.0–49.0)
MCH: 32.1 pg (ref 27.2–33.4)
MCHC: 32.3 g/dL (ref 32.0–36.0)
MCV: 99.4 fL — AB (ref 79.3–98.0)
MONO#: 1.1 10*3/uL — ABNORMAL HIGH (ref 0.1–0.9)
MONO%: 15.1 % — AB (ref 0.0–14.0)
NEUT%: 69 % (ref 39.0–75.0)
NEUTROS ABS: 5.1 10*3/uL (ref 1.5–6.5)
Platelets: 133 10*3/uL — ABNORMAL LOW (ref 140–400)
RBC: 3.36 10*6/uL — AB (ref 4.20–5.82)
RDW: 15.1 % — AB (ref 11.0–14.6)
WBC: 7.3 10*3/uL (ref 4.0–10.3)
lymph#: 1 10*3/uL (ref 0.9–3.3)

## 2015-08-10 MED ORDER — SODIUM CHLORIDE 0.9 % IV SOLN
10.0000 mg | Freq: Once | INTRAVENOUS | Status: AC
  Administered 2015-08-10: 10 mg via INTRAVENOUS
  Filled 2015-08-10: qty 1

## 2015-08-10 MED ORDER — SODIUM CHLORIDE 0.9 % IV SOLN
800.0000 mg/m2 | Freq: Once | INTRAVENOUS | Status: AC
  Administered 2015-08-10: 1520 mg via INTRAVENOUS
  Filled 2015-08-10: qty 39.98

## 2015-08-10 MED ORDER — HEPARIN SOD (PORK) LOCK FLUSH 100 UNIT/ML IV SOLN
500.0000 [IU] | Freq: Once | INTRAVENOUS | Status: AC | PRN
  Administered 2015-08-10: 500 [IU]
  Filled 2015-08-10: qty 5

## 2015-08-10 MED ORDER — PALONOSETRON HCL INJECTION 0.25 MG/5ML
0.2500 mg | Freq: Once | INTRAVENOUS | Status: AC
  Administered 2015-08-10: 0.25 mg via INTRAVENOUS

## 2015-08-10 MED ORDER — SODIUM CHLORIDE 0.9 % IV SOLN
Freq: Once | INTRAVENOUS | Status: AC
  Administered 2015-08-10: 12:00:00 via INTRAVENOUS

## 2015-08-10 MED ORDER — SODIUM CHLORIDE 0.9% FLUSH
10.0000 mL | INTRAVENOUS | Status: DC | PRN
  Administered 2015-08-10: 10 mL via INTRAVENOUS
  Filled 2015-08-10: qty 10

## 2015-08-10 MED ORDER — PACLITAXEL PROTEIN-BOUND CHEMO INJECTION 100 MG
100.0000 mg/m2 | Freq: Once | INTRAVENOUS | Status: AC
  Administered 2015-08-10: 200 mg via INTRAVENOUS
  Filled 2015-08-10: qty 40

## 2015-08-10 MED ORDER — SODIUM CHLORIDE 0.9 % IJ SOLN
10.0000 mL | INTRAMUSCULAR | Status: DC | PRN
  Administered 2015-08-10: 10 mL
  Filled 2015-08-10: qty 10

## 2015-08-10 NOTE — Progress Notes (Signed)
  [facility] OFFICE PROGRESS NOTE   Diagnosis:  Pancreas Chan  INTERVAL HISTORY:   [name] returns as scheduled. He completed cycle 8 gemcitabine/Abraxane 07/27/2015. He denies nausea/vomiting. No mouth sores. No diarrhea. He notes "increased gas" after chemotherapy. Neuropathy symptoms are unchanged. He notes over the past 2 weeks that his right foot "hits flat" with walking. No falls. He denies back pain.   Objective:  Vital signs in last 24 hours:  Blood pressure 125/54, pulse 72, temperature 97.8 F (36.6 C), temperature source Oral, resp. rate 17, height 6' (1.829 m), weight 146 lb 12.8 oz (66.588 kg), SpO2 99 %.    HEENT: No thrush or ulcers. Resp: Lungs clear bilaterally. Cardio: Regular rate and rhythm. GI: Abdomen soft and nontender. No hepatomegaly.  Vascular: Bilateral 1+ pitting pretibial edema. Neuro: Right foot drop. Lower extremity motor strength otherwise intact.  Port-A-Cath without erythema.   Lab Results:  Lab Results  Component Value Date   WBC 7.3 08/10/2015   HGB 10.8* 08/10/2015   HCT 33.4* 08/10/2015   MCV 99.4* 08/10/2015   PLT 133* 08/10/2015   NEUTROABS 5.1 08/10/2015    Imaging:  No results found.  Medications: I have reviewed the patient's current medications.  Assessment/Plan: 1. Metastatic pancreas Chan  CT abdomen/pelvis 03/24/2015 confirmed a pancreas body mass with involvement of the celiac axis,SMV, and splenic vein. Multiple liver metastases  Ultrasound-guided biopsy of a right liver lesion on 04/06/2014 confirmed metastatic adenocarcinoma  Cycle 1 gemcitabine/Abraxane 04/20/2015  Cycle 2 gemcitabine/Abraxane 05/04/2015  Cycle 3 gemcitabine/Abraxane 05/18/2015  Cycle 4 gemcitabine/Abraxane 06/01/2015  Cycle 5 with gemcitabine alone 06/15/2015(Abraxane held due to progressive neuropathy symptoms)  Restaging CT 06/25/2015-slight enlargement of liver lesions, stable pancreas mass, slight decrease in the  size of a gastrohepatic node  Cycle 6 gemcitabine/Abraxane 06/29/2015  Cycle 7 gemcitabine/Abraxane 07/13/2015  Cycle 8 gemcitabine/Abraxane 07/27/2015  Cycle 9 gemcitabine/Abraxane 08/10/2015  2. Weight loss secondary to metastatic pancreas Chan versus diabetes  3. Diabetes  4. Peripheral neuropathy-predating chemotherapy   Disposition:[name] has completed 8 cycles of gemcitabine/Abraxane. Plan to proceed with cycle 9 today as scheduled. The CA 19 9 was further increased 2 weeks ago. If higher today we will refer him for restaging CT scans prior to his next visit.  He has developed a right foot drop. This is likely due to peroneal nerve compression. We discussed a referral to physical therapy to be fitted for a brace. He is not interested in this at present. He understands to contact the office with back pain, progressive leg weakness, bowel/bladder dysfunction or any other worrisome symptoms as this could indicate spinal cord compression.  He will return for a follow-up visit in 2 weeks. He will contact the office in the interim as outlined above or with any other problems.  Plan reviewed with [name]. 25 minutes were spent face-to-face at today's visit with the majority of that time involved in counseling/coordination of care.    [name] ANP/GNP-BC   08/10/2015  10:26 AM

## 2015-08-10 NOTE — Patient Instructions (Signed)

## 2015-08-10 NOTE — Patient Instructions (Signed)
[facility] Discharge Instructions for Patients Receiving Chemotherapy  Today you received the following chemotherapy agents Abraxane/Gemzar  To help prevent nausea and vomiting after your treatment, we encourage you to take your nausea medication    If you develop nausea and vomiting that is not controlled by your nausea medication, call the clinic.   BELOW ARE SYMPTOMS THAT SHOULD BE REPORTED IMMEDIATELY:  *FEVER GREATER THAN 100.5 F  *CHILLS WITH OR WITHOUT FEVER  NAUSEA AND VOMITING THAT IS NOT CONTROLLED WITH YOUR NAUSEA MEDICATION  *UNUSUAL SHORTNESS OF BREATH  *UNUSUAL BRUISING OR BLEEDING  TENDERNESS IN MOUTH AND THROAT WITH OR WITHOUT PRESENCE OF ULCERS  *URINARY PROBLEMS  *BOWEL PROBLEMS  UNUSUAL RASH Items with * indicate a potential emergency and should be followed up as soon as possible.  Feel free to call the clinic you have any questions or concerns. The clinic phone number is [phone].  Please show the CHEMO ALERT CARD at check-in to the Emergency Department and triage nurse.   

## 2015-08-10 NOTE — Telephone Encounter (Signed)
Gave pt appt & avs °

## 2015-08-11 LAB — CANCER ANTIGEN 19-9: CA 19-9: 38511 U/mL — ABNORMAL HIGH (ref 0–35)

## 2015-08-21 ENCOUNTER — Other Ambulatory Visit: Payer: Self-pay

## 2015-08-21 DIAGNOSIS — Z95828 Presence of other vascular implants and grafts: Secondary | ICD-10-CM

## 2015-08-23 ENCOUNTER — Other Ambulatory Visit: Payer: Self-pay | Admitting: Oncology

## 2015-08-24 ENCOUNTER — Ambulatory Visit (HOSPITAL_BASED_OUTPATIENT_CLINIC_OR_DEPARTMENT_OTHER): Payer: Medicare Other

## 2015-08-24 ENCOUNTER — Telehealth: Payer: Self-pay | Admitting: Oncology

## 2015-08-24 ENCOUNTER — Telehealth: Payer: Self-pay | Admitting: *Deleted

## 2015-08-24 ENCOUNTER — Ambulatory Visit: Payer: Medicare Other | Admitting: Nutrition

## 2015-08-24 ENCOUNTER — Other Ambulatory Visit (HOSPITAL_BASED_OUTPATIENT_CLINIC_OR_DEPARTMENT_OTHER): Payer: Medicare Other

## 2015-08-24 ENCOUNTER — Ambulatory Visit: Payer: Medicare Other

## 2015-08-24 ENCOUNTER — Encounter: Payer: Self-pay | Admitting: *Deleted

## 2015-08-24 ENCOUNTER — Ambulatory Visit (HOSPITAL_BASED_OUTPATIENT_CLINIC_OR_DEPARTMENT_OTHER): Payer: Medicare Other | Admitting: Oncology

## 2015-08-24 VITALS — BP 134/78 | HR 71 | Temp 97.8°F | Resp 18 | Ht 72.0 in | Wt 148.9 lb

## 2015-08-24 DIAGNOSIS — C787 Secondary malignant neoplasm of liver and intrahepatic bile duct: Secondary | ICD-10-CM

## 2015-08-24 DIAGNOSIS — C251 Malignant neoplasm of body of pancreas: Secondary | ICD-10-CM

## 2015-08-24 DIAGNOSIS — Z5111 Encounter for antineoplastic chemotherapy: Secondary | ICD-10-CM

## 2015-08-24 DIAGNOSIS — Z95828 Presence of other vascular implants and grafts: Secondary | ICD-10-CM

## 2015-08-24 LAB — COMPREHENSIVE METABOLIC PANEL
ALT: 34 U/L (ref 0–55)
ANION GAP: 8 meq/L (ref 3–11)
AST: 30 U/L (ref 5–34)
Albumin: 3.5 g/dL (ref 3.5–5.0)
Alkaline Phosphatase: 66 U/L (ref 40–150)
BILIRUBIN TOTAL: 0.58 mg/dL (ref 0.20–1.20)
BUN: 20.2 mg/dL (ref 7.0–26.0)
CALCIUM: 9.1 mg/dL (ref 8.4–10.4)
CO2: 26 mEq/L (ref 22–29)
CREATININE: 0.9 mg/dL (ref 0.7–1.3)
Chloride: 105 mEq/L (ref 98–109)
EGFR: 82 mL/min/{1.73_m2} — AB (ref >90)
Glucose: 228 mg/dl — ABNORMAL HIGH (ref 70–140)
Potassium: 3.8 mEq/L (ref 3.5–5.1)
Sodium: 138 mEq/L (ref 136–145)
TOTAL PROTEIN: 6.2 g/dL — AB (ref 6.4–8.3)

## 2015-08-24 LAB — CBC WITH DIFFERENTIAL/PLATELET
BASO%: 0.5 % (ref 0.0–2.0)
Basophils Absolute: 0 10*3/uL (ref 0.0–0.1)
EOS ABS: 0.1 10*3/uL (ref 0.0–0.5)
EOS%: 2.2 % (ref 0.0–7.0)
HEMATOCRIT: 34.1 % — AB (ref 38.4–49.9)
HGB: 11.1 g/dL — ABNORMAL LOW (ref 13.0–17.1)
LYMPH#: 1 10*3/uL (ref 0.9–3.3)
LYMPH%: 17 % (ref 14.0–49.0)
MCH: 32.3 pg (ref 27.2–33.4)
MCHC: 32.6 g/dL (ref 32.0–36.0)
MCV: 99.1 fL — AB (ref 79.3–98.0)
MONO#: 0.7 10*3/uL (ref 0.1–0.9)
MONO%: 12 % (ref 0.0–14.0)
NEUT%: 68.3 % (ref 39.0–75.0)
NEUTROS ABS: 4.1 10*3/uL (ref 1.5–6.5)
PLATELETS: 134 10*3/uL — AB (ref 140–400)
RBC: 3.44 10*6/uL — ABNORMAL LOW (ref 4.20–5.82)
RDW: 15.2 % — ABNORMAL HIGH (ref 11.0–14.6)
WBC: 5.9 10*3/uL (ref 4.0–10.3)

## 2015-08-24 MED ORDER — HEPARIN SOD (PORK) LOCK FLUSH 100 UNIT/ML IV SOLN
500.0000 [IU] | Freq: Once | INTRAVENOUS | Status: AC | PRN
  Administered 2015-08-24: 500 [IU]
  Filled 2015-08-24: qty 5

## 2015-08-24 MED ORDER — SODIUM CHLORIDE 0.9 % IV SOLN
800.0000 mg/m2 | Freq: Once | INTRAVENOUS | Status: AC
  Administered 2015-08-24: 1520 mg via INTRAVENOUS
  Filled 2015-08-24: qty 39.98

## 2015-08-24 MED ORDER — PACLITAXEL PROTEIN-BOUND CHEMO INJECTION 100 MG
100.0000 mg/m2 | Freq: Once | INTRAVENOUS | Status: AC
  Administered 2015-08-24: 200 mg via INTRAVENOUS
  Filled 2015-08-24: qty 40

## 2015-08-24 MED ORDER — SODIUM CHLORIDE 0.9 % IJ SOLN
10.0000 mL | INTRAMUSCULAR | Status: DC | PRN
  Administered 2015-08-24: 10 mL
  Filled 2015-08-24: qty 10

## 2015-08-24 MED ORDER — SODIUM CHLORIDE 0.9 % IV SOLN
10.0000 mg | Freq: Once | INTRAVENOUS | Status: AC
  Administered 2015-08-24: 10 mg via INTRAVENOUS
  Filled 2015-08-24: qty 1

## 2015-08-24 MED ORDER — PALONOSETRON HCL INJECTION 0.25 MG/5ML
0.2500 mg | Freq: Once | INTRAVENOUS | Status: AC
  Administered 2015-08-24: 0.25 mg via INTRAVENOUS

## 2015-08-24 MED ORDER — SODIUM CHLORIDE 0.9 % IJ SOLN
10.0000 mL | INTRAMUSCULAR | Status: DC | PRN
  Administered 2015-08-24: 10 mL via INTRAVENOUS
  Filled 2015-08-24: qty 10

## 2015-08-24 MED ORDER — SODIUM CHLORIDE 0.9 % IV SOLN
Freq: Once | INTRAVENOUS | Status: AC
  Administered 2015-08-24: 10:00:00 via INTRAVENOUS

## 2015-08-24 NOTE — Progress Notes (Signed)
Oncology Nurse Navigator Documentation  Oncology Nurse Navigator Flowsheets 08/24/2015  Navigator Location [facility]  Navigator Encounter Type Treatment  Abnormal Finding Date -  Confirmed Diagnosis Date -  Treatment Initiated Date -  Patient Visit Type MedOnc  Treatment Phase Active Tx--Gemzar/Abraxane  Barriers/Navigation Needs No barriers at this time;No Questions;No Needs  Education -  Interventions None required  Education Method -  Support Groups/Services Other;GI Support Group  Acuity -  Time Spent with Patient 15  Met with [name] and his wife in infusion room. Has no needs/issues. Eating well and feeling well. Foot drop is improved. Provided info regarding the support groups as well as free massage opportunity. He tells RN "I don't do support groups".

## 2015-08-24 NOTE — Patient Instructions (Signed)
[facility] Discharge Instructions for Patients Receiving Chemotherapy  Today you received the following chemotherapy agents Abraxane and Gemzar.  To help prevent nausea and vomiting after your treatment, we encourage you to take your nausea medication.   If you develop nausea and vomiting that is not controlled by your nausea medication, call the clinic.   BELOW ARE SYMPTOMS THAT SHOULD BE REPORTED IMMEDIATELY:  *FEVER GREATER THAN 100.5 F  *CHILLS WITH OR WITHOUT FEVER  NAUSEA AND VOMITING THAT IS NOT CONTROLLED WITH YOUR NAUSEA MEDICATION  *UNUSUAL SHORTNESS OF BREATH  *UNUSUAL BRUISING OR BLEEDING  TENDERNESS IN MOUTH AND THROAT WITH OR WITHOUT PRESENCE OF ULCERS  *URINARY PROBLEMS  *BOWEL PROBLEMS  UNUSUAL RASH Items with * indicate a potential emergency and should be followed up as soon as possible.  Feel free to call the clinic you have any questions or concerns. The clinic phone number is [phone].  Please show the CHEMO ALERT CARD at check-in to the Emergency Department and triage nurse.   

## 2015-08-24 NOTE — Progress Notes (Signed)
Brief nutrition follow-up with patient during infusion. Patient states he eats well but does continue to use Glucerna TID. Weight improved and documented as 148 pounds. There is no nutrition diagnosis Provided additional Glucerna coupons at his request. Will continue to follow patient for any new needs. Follow-up on Monday, June 16.  **Disclaimer: This note was dictated with voice recognition software. Similar sounding words can inadvertently be transcribed and this note may contain transcription errors which may not have been corrected upon publication of note.**

## 2015-08-24 NOTE — Progress Notes (Signed)
  [place] OFFICE PROGRESS NOTE   Diagnosis: Pancreas cancer  INTERVAL HISTORY:   Mr. [name] returns as scheduled. He was last treated with chemotherapy 08/10/2015. He reports tolerating the chemotherapy well. No fever or rash. No change in baseline neuropathy symptoms. Good appetite. No pain. The right foot drop has improved.  Objective:  Vital signs in last 24 hours:  Blood pressure 134/78, pulse 71, temperature 97.8 F (36.6 C), temperature source Oral, resp. rate 18, height 6' (1.829 m), weight 148 lb 14.4 oz (67.541 kg), SpO2 98 %.    HEENT: No thrush or ulcers Resp: Lungs clear bilaterally Cardio: Regular rate and rhythm GI: No hepatomegaly, nontender Vascular: Trace edema at the right greater than left lower leg Neuro: 4/5 strength with dorsi flexion at the right foot     Portacath/PICC-without erythema  Lab Results:  Lab Results  Component Value Date   WBC 5.9 08/24/2015   HGB 11.1* 08/24/2015   HCT 34.1* 08/24/2015   MCV 99.1* 08/24/2015   PLT 134* 08/24/2015   NEUTROABS 4.1 08/24/2015    CA 19-9 on 08/10/2015: 38,511  Medications: I have reviewed the patient's current medications.  Assessment/Plan: 1. Metastatic pancreas cancer  CT abdomen/pelvis 03/24/2015 confirmed a pancreas body mass with involvement of the celiac axis,SMV, and splenic vein. Multiple liver metastases  Ultrasound-guided biopsy of a right liver lesion on 04/06/2014 confirmed metastatic adenocarcinoma  Cycle 1 gemcitabine/Abraxane 04/20/2015  Cycle 2 gemcitabine/Abraxane 05/04/2015  Cycle 3 gemcitabine/Abraxane 05/18/2015  Cycle 4 gemcitabine/Abraxane 06/01/2015  Cycle 5 with gemcitabine alone 06/15/2015(Abraxane held due to progressive neuropathy symptoms)  Restaging CT 06/25/2015-slight enlargement of liver lesions, stable pancreas mass, slight decrease in the size of a gastrohepatic node  Cycle 6 gemcitabine/Abraxane 06/29/2015  Cycle 7 gemcitabine/Abraxane  07/13/2015  Cycle 8 gemcitabine/Abraxane 07/27/2015  Cycle 9 gemcitabine/Abraxane 08/10/2015  Cycle 10 gemcitabine/Abraxane 08/24/2015  2. Weight loss secondary to metastatic pancreas cancer versus diabetes  3. Diabetes  4. Peripheral neuropathy-predating chemotherapy  5.   Right foot drop-improved, most likely related to peripheral nerve compression   Disposition:  Mr. [name] appears stable. The plan is to proceed with another cycle of gemcitabine/Abraxane today. He will undergo a restaging CT prior to a follow-up visit in 2 weeks.  Betsy Coder, MD  08/24/2015  9:03 AM

## 2015-08-24 NOTE — Patient Instructions (Signed)

## 2015-08-24 NOTE — Telephone Encounter (Signed)
per pof to sch pt appt-sent MW emailto sch trmt-pt to getupdated copy b4 leaving** °

## 2015-08-24 NOTE — Telephone Encounter (Signed)
Per staff message and POF I have scheduled appts. Advised scheduler of appts. JMW  

## 2015-08-25 LAB — CANCER ANTIGEN 19-9

## 2015-09-04 ENCOUNTER — Ambulatory Visit (HOSPITAL_COMMUNITY)
Admission: RE | Admit: 2015-09-04 | Discharge: 2015-09-04 | Disposition: A | Discharge status: Home / Self Care | Payer: Medicare Other | Source: Ambulatory Visit | Attending: Oncology | Admitting: Oncology

## 2015-09-04 ENCOUNTER — Encounter (HOSPITAL_COMMUNITY): Payer: Self-pay

## 2015-09-04 DIAGNOSIS — C251 Malignant neoplasm of body of pancreas: Secondary | ICD-10-CM | POA: Diagnosis not present

## 2015-09-04 DIAGNOSIS — R195 Other fecal abnormalities: Secondary | ICD-10-CM | POA: Insufficient documentation

## 2015-09-04 DIAGNOSIS — C787 Secondary malignant neoplasm of liver and intrahepatic bile duct: Secondary | ICD-10-CM | POA: Insufficient documentation

## 2015-09-04 MED ORDER — IOPAMIDOL (ISOVUE-300) INJECTION 61%
100.0000 mL | Freq: Once | INTRAVENOUS | Status: AC | PRN
  Administered 2015-09-04: 100 mL via INTRAVENOUS

## 2015-09-06 ENCOUNTER — Other Ambulatory Visit: Payer: Self-pay | Admitting: Oncology

## 2015-09-07 ENCOUNTER — Ambulatory Visit (HOSPITAL_BASED_OUTPATIENT_CLINIC_OR_DEPARTMENT_OTHER): Payer: Medicare Other

## 2015-09-07 ENCOUNTER — Ambulatory Visit (HOSPITAL_BASED_OUTPATIENT_CLINIC_OR_DEPARTMENT_OTHER): Payer: Medicare Other | Admitting: Oncology

## 2015-09-07 ENCOUNTER — Ambulatory Visit: Payer: Medicare Other

## 2015-09-07 ENCOUNTER — Other Ambulatory Visit (HOSPITAL_BASED_OUTPATIENT_CLINIC_OR_DEPARTMENT_OTHER): Payer: Medicare Other

## 2015-09-07 VITALS — BP 112/71 | HR 71 | Temp 97.7°F | Resp 18 | Ht 72.0 in | Wt 146.2 lb

## 2015-09-07 DIAGNOSIS — C251 Malignant neoplasm of body of pancreas: Secondary | ICD-10-CM | POA: Diagnosis present

## 2015-09-07 DIAGNOSIS — G629 Polyneuropathy, unspecified: Secondary | ICD-10-CM

## 2015-09-07 DIAGNOSIS — E119 Type 2 diabetes mellitus without complications: Secondary | ICD-10-CM | POA: Diagnosis not present

## 2015-09-07 DIAGNOSIS — C787 Secondary malignant neoplasm of liver and intrahepatic bile duct: Secondary | ICD-10-CM | POA: Diagnosis not present

## 2015-09-07 DIAGNOSIS — Z95828 Presence of other vascular implants and grafts: Secondary | ICD-10-CM

## 2015-09-07 DIAGNOSIS — Z5111 Encounter for antineoplastic chemotherapy: Secondary | ICD-10-CM

## 2015-09-07 LAB — CBC WITH DIFFERENTIAL/PLATELET
BASO%: 0.6 % (ref 0.0–2.0)
Basophils Absolute: 0 10*3/uL (ref 0.0–0.1)
EOS ABS: 0.1 10*3/uL (ref 0.0–0.5)
EOS%: 1.5 % (ref 0.0–7.0)
HEMATOCRIT: 32.7 % — AB (ref 38.4–49.9)
HEMOGLOBIN: 10.4 g/dL — AB (ref 13.0–17.1)
LYMPH#: 0.8 10*3/uL — AB (ref 0.9–3.3)
LYMPH%: 12.2 % — ABNORMAL LOW (ref 14.0–49.0)
MCH: 32.1 pg (ref 27.2–33.4)
MCHC: 31.8 g/dL — ABNORMAL LOW (ref 32.0–36.0)
MCV: 100.9 fL — AB (ref 79.3–98.0)
MONO#: 1 10*3/uL — ABNORMAL HIGH (ref 0.1–0.9)
MONO%: 14.5 % — AB (ref 0.0–14.0)
NEUT%: 71.2 % (ref 39.0–75.0)
NEUTROS ABS: 4.8 10*3/uL (ref 1.5–6.5)
Platelets: 164 10*3/uL (ref 140–400)
RBC: 3.24 10*6/uL — ABNORMAL LOW (ref 4.20–5.82)
RDW: 15.2 % — AB (ref 11.0–14.6)
WBC: 6.7 10*3/uL (ref 4.0–10.3)

## 2015-09-07 LAB — COMPREHENSIVE METABOLIC PANEL
ALBUMIN: 3.1 g/dL — AB (ref 3.5–5.0)
ALK PHOS: 62 U/L (ref 40–150)
ALT: 28 U/L (ref 0–55)
AST: 24 U/L (ref 5–34)
Anion Gap: 7 mEq/L (ref 3–11)
BILIRUBIN TOTAL: 0.54 mg/dL (ref 0.20–1.20)
BUN: 21.8 mg/dL (ref 7.0–26.0)
CO2: 24 meq/L (ref 22–29)
CREATININE: 0.9 mg/dL (ref 0.7–1.3)
Calcium: 8.9 mg/dL (ref 8.4–10.4)
Chloride: 105 mEq/L (ref 98–109)
EGFR: 77 mL/min/{1.73_m2} — ABNORMAL LOW (ref >90)
GLUCOSE: 344 mg/dL — AB (ref 70–140)
Potassium: 4.2 mEq/L (ref 3.5–5.1)
SODIUM: 137 meq/L (ref 136–145)
TOTAL PROTEIN: 5.7 g/dL — AB (ref 6.4–8.3)

## 2015-09-07 MED ORDER — HEPARIN SOD (PORK) LOCK FLUSH 100 UNIT/ML IV SOLN
500.0000 [IU] | Freq: Once | INTRAVENOUS | Status: AC | PRN
  Administered 2015-09-07: 500 [IU]
  Filled 2015-09-07: qty 5

## 2015-09-07 MED ORDER — SODIUM CHLORIDE 0.9 % IV SOLN
10.0000 mg | Freq: Once | INTRAVENOUS | Status: AC
  Administered 2015-09-07: 10 mg via INTRAVENOUS
  Filled 2015-09-07: qty 1

## 2015-09-07 MED ORDER — SODIUM CHLORIDE 0.9 % IV SOLN
800.0000 mg/m2 | Freq: Once | INTRAVENOUS | Status: AC
  Administered 2015-09-07: 1520 mg via INTRAVENOUS
  Filled 2015-09-07: qty 39.98

## 2015-09-07 MED ORDER — SODIUM CHLORIDE 0.9 % IV SOLN
Freq: Once | INTRAVENOUS | Status: AC
  Administered 2015-09-07: 09:00:00 via INTRAVENOUS

## 2015-09-07 MED ORDER — SODIUM CHLORIDE 0.9% FLUSH
10.0000 mL | INTRAVENOUS | Status: DC | PRN
Start: 2015-09-07 — End: 2015-09-07
  Administered 2015-09-07: 10 mL via INTRAVENOUS
  Filled 2015-09-07: qty 10

## 2015-09-07 MED ORDER — SODIUM CHLORIDE 0.9 % IJ SOLN
10.0000 mL | INTRAMUSCULAR | Status: DC | PRN
  Administered 2015-09-07: 10 mL
  Filled 2015-09-07: qty 10

## 2015-09-07 MED ORDER — PALONOSETRON HCL INJECTION 0.25 MG/5ML
0.2500 mg | Freq: Once | INTRAVENOUS | Status: AC
  Administered 2015-09-07: 0.25 mg via INTRAVENOUS

## 2015-09-07 MED ORDER — PACLITAXEL PROTEIN-BOUND CHEMO INJECTION 100 MG
100.0000 mg/m2 | Freq: Once | INTRAVENOUS | Status: AC
  Administered 2015-09-07: 200 mg via INTRAVENOUS
  Filled 2015-09-07: qty 40

## 2015-09-07 NOTE — Patient Instructions (Signed)
[facility] Discharge Instructions for Patients Receiving Chemotherapy  Today you received the following chemotherapy agents: Abraxane, Gemzar  To help prevent nausea and vomiting after your treatment, we encourage you to take your nausea medication as prescribed by your physician.   If you develop nausea and vomiting that is not controlled by your nausea medication, call the clinic.   BELOW ARE SYMPTOMS THAT SHOULD BE REPORTED IMMEDIATELY:  *FEVER GREATER THAN 100.5 F  *CHILLS WITH OR WITHOUT FEVER  NAUSEA AND VOMITING THAT IS NOT CONTROLLED WITH YOUR NAUSEA MEDICATION  *UNUSUAL SHORTNESS OF BREATH  *UNUSUAL BRUISING OR BLEEDING  TENDERNESS IN MOUTH AND THROAT WITH OR WITHOUT PRESENCE OF ULCERS  *URINARY PROBLEMS  *BOWEL PROBLEMS  UNUSUAL RASH Items with * indicate a potential emergency and should be followed up as soon as possible.  Feel free to call the clinic you have any questions or concerns. The clinic phone number is [phone].  Please show the CHEMO ALERT CARD at check-in to the Emergency Department and triage nurse.   

## 2015-09-07 NOTE — Progress Notes (Signed)
[place] OFFICE PROGRESS NOTE   Diagnosis: Pancreas cancer  INTERVAL HISTORY:   [name] returns as scheduled. He continues every 2 week gemcitabine/Abraxane. He reports tolerating the chemotherapy well. He has malaise following chemotherapy and increased upper abdominal "gas ". The gas sensation improves when he walks for exercise. He is having bowel movements. Good appetite. Stable numbness in the fingers. This makes it difficult for him to button his shirt otherwise does not interfere with activity. He continues to have a right foot drop, but this has improved.  Objective:  Vital signs in last 24 hours:  Blood pressure 112/71, pulse 71, temperature 97.7 F (36.5 C), temperature source Oral, resp. rate 18, height 6' (1.829 m), weight 146 lb 3.2 oz (66.316 kg), SpO2 100 %.    HEENT: No thrush or ulcers Resp: Lungs clear bilaterally Cardio: Regular rate and rhythm GI: No hepatomegaly, no mass, nontender Vascular: Trace pitting edema at the left greater than right lower leg Neuro: Moderate to severe decrease in vibratory sense at the fingertips bilaterally , 4/5 strength with dorsi flexion at the right foot   Portacath/PICC-without erythema  Lab Results:  Lab Results  Component Value Date   WBC 6.7 09/07/2015   HGB 10.4* 09/07/2015   HCT 32.7* 09/07/2015   MCV 100.9* 09/07/2015   PLT 164 09/07/2015   NEUTROABS 4.8 09/07/2015   08/24/2015: CA 19-9-  [id number] Imaging:  Ct Abdomen Pelvis W Contrast  09/04/2015  CLINICAL DATA:  Pancreatic cancer with liver metastasis diagnosed 12/16. Diabetes. Hypertension. EXAM: CT ABDOMEN AND PELVIS WITH CONTRAST TECHNIQUE: Multidetector CT imaging of the abdomen and pelvis was performed using the standard protocol following bolus administration of intravenous contrast. CONTRAST:  195mL ISOVUE-300 IOPAMIDOL (ISOVUE-300) INJECTION 61% COMPARISON:  06/25/2015 FINDINGS: Lower chest: 7 mm right lower lobe pulmonary nodule on image  9/series 7 is unchanged. The right middle lobe nodular scarring is less apparent today. Mild cardiomegaly with coronary artery atherosclerosis. Tiny hiatal hernia. No pericardial or pleural effusion. Multiple small retrocrural nodes. These measure up to 7 mm, upper normal. Hepatobiliary: Hepatic metastasis again identified. Index pericholecystic right liver lobe lesion measures 5.1 x 4.1 cm on image 37/series 6. Compare 6.3 x 5.4 cm on the prior Heterogeneous segment 3 lesion is relatively ill-defined, decreased in size today. Example 3.4 x 3.5 cm on image 29/ series 6. Compare 3.9 x 3.8 cm on the prior exam (when remeasured). Index segment 4B lesion measures 3.1 x 2.0 cm on image 35/series 6. Compare 3.8 x 2.9 cm on the prior exam. Gallbladder distension, without calcified stone or surrounding inflammation. No biliary duct dilatation. Pancreas: Pancreatic duct dilatation within the tail and body. Pancreatic neck lesion is difficult to differentiate from upstream duct dilatation. Felt to measure on the order of 4.2 x 3.1 cm on image 37/ series 6. Compare 4.1 x 3.2 cm on the prior exam (when remeasured). Spleen: Normal in size, without focal abnormality. Adrenals/Urinary Tract: Normal adrenal glands. Too small to characterize left renal lesions. Normal right kidney, without hydronephrosis. Normal urinary bladder. Stomach/Bowel: gastric cardia and antrum both underdistended. Apparent wall thickening could be secondary. Similar. Scattered colonic diverticula. Colonic stool burden suggests constipation. Normal terminal ileum. Small bowel is normal in caliber. There are foci of small bowel within areas of the abdominal wall laxity. Vascular/Lymphatic: Aortic and branch vessel atherosclerosis. Infrarenal aortic dilatation at up to the 3.0 cm is similar. Patent portal vein. Splenic vein conclusion secondary to tumor with gastroepiploic collaterals. SMV also involved  with tumor. No abdominopelvic adenopathy. Reproductive:  Mild prostatomegaly. Other: No significant free fluid. No evidence of omental or peritoneal disease. Musculoskeletal: No acute osseous abnormality. Degenerative disc disease at L3-4 and L4-5. IMPRESSION: 1. Similar size of a a pancreatic neck lesion. 2. Mild improvement in hepatic metastasis. 3.  Possible constipation. Electronically Signed   By: Abigail Miyamoto M.D.   On: 09/04/2015 10:10  Images reviewed  Medications: I have reviewed the patient's current medications.  Assessment/Plan: 1. Metastatic pancreas cancer  CT abdomen/pelvis 03/24/2015 confirmed a pancreas body mass with involvement of the celiac axis,SMV, and splenic vein. Multiple liver metastases  Ultrasound-guided biopsy of a right liver lesion on 04/06/2014 confirmed metastatic adenocarcinoma  Cycle 1 gemcitabine/Abraxane 04/20/2015  Cycle 2 gemcitabine/Abraxane 05/04/2015  Cycle 3 gemcitabine/Abraxane 05/18/2015  Cycle 4 gemcitabine/Abraxane 06/01/2015  Cycle 5 with gemcitabine alone 06/15/2015(Abraxane held due to progressive neuropathy symptoms)  Restaging CT 06/25/2015-slight enlargement of liver lesions, stable pancreas mass, slight decrease in the size of a gastrohepatic node  Cycle 6 gemcitabine/Abraxane 06/29/2015  Cycle 7 gemcitabine/Abraxane 07/13/2015  Cycle 8 gemcitabine/Abraxane 07/27/2015  Cycle 9 gemcitabine/Abraxane 08/10/2015  Cycle 10 gemcitabine/Abraxane 08/24/2015  CTs 09/04/2015-improvement in size of liver metastases, stable pancreas mass  2. Weight loss secondary to metastatic pancreas cancer versus diabetes  3. Diabetes  4. Peripheral neuropathy-predating chemotherapy  5. Right foot drop-improved, most likely related to peripheral nerve compression   Disposition:  [name] appears unchanged. The restaging CTs indicate improvement in the liver metastases. The CA 19-9 has increased slightly over the past few months. His performance status remains excellent. We decided to  continue every 2 week gemcitabine/Abraxane. He will return for an office visit and chemotherapy in 2 weeks.  We will continue monitoring the foot drop and peripheral neuropathy closely. He declines a foot brace.  Betsy Coder, MD  09/07/2015  9:06 AM

## 2015-09-08 LAB — CANCER ANTIGEN 19-9

## 2015-09-20 ENCOUNTER — Other Ambulatory Visit: Payer: Self-pay | Admitting: Oncology

## 2015-09-21 ENCOUNTER — Telehealth: Payer: Self-pay | Admitting: Oncology

## 2015-09-21 ENCOUNTER — Ambulatory Visit: Payer: Medicare Other | Admitting: Nutrition

## 2015-09-21 ENCOUNTER — Other Ambulatory Visit: Payer: Self-pay

## 2015-09-21 ENCOUNTER — Ambulatory Visit (HOSPITAL_BASED_OUTPATIENT_CLINIC_OR_DEPARTMENT_OTHER): Payer: Medicare Other | Admitting: Oncology

## 2015-09-21 ENCOUNTER — Ambulatory Visit (HOSPITAL_BASED_OUTPATIENT_CLINIC_OR_DEPARTMENT_OTHER): Payer: Medicare Other

## 2015-09-21 ENCOUNTER — Ambulatory Visit: Payer: Medicare Other

## 2015-09-21 ENCOUNTER — Other Ambulatory Visit (HOSPITAL_BASED_OUTPATIENT_CLINIC_OR_DEPARTMENT_OTHER): Payer: Medicare Other

## 2015-09-21 VITALS — BP 108/68 | HR 72 | Temp 97.8°F | Resp 18 | Wt 146.7 lb

## 2015-09-21 DIAGNOSIS — I441 Atrioventricular block, second degree: Secondary | ICD-10-CM | POA: Diagnosis not present

## 2015-09-21 DIAGNOSIS — R001 Bradycardia, unspecified: Secondary | ICD-10-CM | POA: Diagnosis not present

## 2015-09-21 DIAGNOSIS — E119 Type 2 diabetes mellitus without complications: Secondary | ICD-10-CM

## 2015-09-21 DIAGNOSIS — C251 Malignant neoplasm of body of pancreas: Secondary | ICD-10-CM | POA: Diagnosis present

## 2015-09-21 DIAGNOSIS — Z95828 Presence of other vascular implants and grafts: Secondary | ICD-10-CM

## 2015-09-21 DIAGNOSIS — Z5111 Encounter for antineoplastic chemotherapy: Secondary | ICD-10-CM | POA: Diagnosis present

## 2015-09-21 DIAGNOSIS — C787 Secondary malignant neoplasm of liver and intrahepatic bile duct: Secondary | ICD-10-CM | POA: Diagnosis not present

## 2015-09-21 LAB — COMPREHENSIVE METABOLIC PANEL
ALT: 33 U/L (ref 0–55)
AST: 31 U/L (ref 5–34)
Albumin: 3.2 g/dL — ABNORMAL LOW (ref 3.5–5.0)
Alkaline Phosphatase: 63 U/L (ref 40–150)
Anion Gap: 7 mEq/L (ref 3–11)
BUN: 21.4 mg/dL (ref 7.0–26.0)
CALCIUM: 8.8 mg/dL (ref 8.4–10.4)
CHLORIDE: 104 meq/L (ref 98–109)
CO2: 25 mEq/L (ref 22–29)
CREATININE: 0.9 mg/dL (ref 0.7–1.3)
EGFR: 81 mL/min/{1.73_m2} — ABNORMAL LOW (ref >90)
Glucose: 328 mg/dl — ABNORMAL HIGH (ref 70–140)
Potassium: 4.7 mEq/L (ref 3.5–5.1)
SODIUM: 136 meq/L (ref 136–145)
Total Bilirubin: 0.56 mg/dL (ref 0.20–1.20)
Total Protein: 5.9 g/dL — ABNORMAL LOW (ref 6.4–8.3)

## 2015-09-21 LAB — CBC WITH DIFFERENTIAL/PLATELET
BASO%: 1.1 % (ref 0.0–2.0)
BASOS ABS: 0.1 10*3/uL (ref 0.0–0.1)
EOS ABS: 0.1 10*3/uL (ref 0.0–0.5)
EOS%: 1.4 % (ref 0.0–7.0)
HCT: 33.4 % — ABNORMAL LOW (ref 38.4–49.9)
HGB: 10.6 g/dL — ABNORMAL LOW (ref 13.0–17.1)
LYMPH%: 16.6 % (ref 14.0–49.0)
MCH: 31.9 pg (ref 27.2–33.4)
MCHC: 31.8 g/dL — AB (ref 32.0–36.0)
MCV: 100.2 fL — AB (ref 79.3–98.0)
MONO#: 0.9 10*3/uL (ref 0.1–0.9)
MONO%: 14.2 % — AB (ref 0.0–14.0)
NEUT#: 4.3 10*3/uL (ref 1.5–6.5)
NEUT%: 66.7 % (ref 39.0–75.0)
Platelets: 137 10*3/uL — ABNORMAL LOW (ref 140–400)
RBC: 3.33 10*6/uL — AB (ref 4.20–5.82)
RDW: 16.1 % — AB (ref 11.0–14.6)
WBC: 6.5 10*3/uL (ref 4.0–10.3)
lymph#: 1.1 10*3/uL (ref 0.9–3.3)

## 2015-09-21 MED ORDER — SODIUM CHLORIDE 0.9 % IV SOLN
800.0000 mg/m2 | Freq: Once | INTRAVENOUS | Status: AC
  Administered 2015-09-21: 1520 mg via INTRAVENOUS
  Filled 2015-09-21: qty 39.98

## 2015-09-21 MED ORDER — SODIUM CHLORIDE 0.9 % IJ SOLN
10.0000 mL | INTRAMUSCULAR | Status: DC | PRN
  Administered 2015-09-21: 10 mL
  Filled 2015-09-21: qty 10

## 2015-09-21 MED ORDER — SODIUM CHLORIDE 0.9 % IJ SOLN
10.0000 mL | INTRAMUSCULAR | Status: DC | PRN
  Administered 2015-09-21: 10 mL via INTRAVENOUS
  Filled 2015-09-21: qty 10

## 2015-09-21 MED ORDER — PALONOSETRON HCL INJECTION 0.25 MG/5ML
0.2500 mg | Freq: Once | INTRAVENOUS | Status: AC
  Administered 2015-09-21: 0.25 mg via INTRAVENOUS

## 2015-09-21 MED ORDER — SODIUM CHLORIDE 0.9 % IV SOLN
10.0000 mg | Freq: Once | INTRAVENOUS | Status: AC
  Administered 2015-09-21: 10 mg via INTRAVENOUS
  Filled 2015-09-21: qty 1

## 2015-09-21 MED ORDER — HEPARIN SOD (PORK) LOCK FLUSH 100 UNIT/ML IV SOLN
500.0000 [IU] | Freq: Once | INTRAVENOUS | Status: AC | PRN
  Administered 2015-09-21: 500 [IU]
  Filled 2015-09-21: qty 5

## 2015-09-21 MED ORDER — SODIUM CHLORIDE 0.9 % IV SOLN
Freq: Once | INTRAVENOUS | Status: AC
  Administered 2015-09-21: 13:00:00 via INTRAVENOUS

## 2015-09-21 MED ORDER — PACLITAXEL PROTEIN-BOUND CHEMO INJECTION 100 MG
100.0000 mg/m2 | Freq: Once | INTRAVENOUS | Status: AC
  Administered 2015-09-21: 200 mg via INTRAVENOUS
  Filled 2015-09-21: qty 40

## 2015-09-21 NOTE — Progress Notes (Signed)
Patient reports he is eating well. Tries to drink Glucerna TID. Provided coupons. Will continue to follow as needed.

## 2015-09-21 NOTE — Telephone Encounter (Signed)
Gave and printed appt sched and avs for pt for July  °

## 2015-09-21 NOTE — Progress Notes (Signed)
  [place] OFFICE PROGRESS NOTE   Diagnosis: Pancreas cancer  INTERVAL HISTORY:   [name] returns as scheduled. No pain. Good appetite. He is walking for exercise. No change in neuropathy symptoms. He has some difficulty buttoning his shirt. He had neuropathy prior to beginning chemotherapy.  Objective:  Vital signs in last 24 hours:  Blood pressure 108/68, pulse 72, temperature 97.8 F (36.6 C), temperature source Oral, resp. rate 18, weight 146 lb 11.2 oz (66.543 kg), SpO2 96 %.    HEENT: No thrush or ulcers Resp: Lungs clear bilaterally Cardio: Irregular, bradycardia GI: No hepatomegaly, no mass, nontender Vascular: Trace low leg/ankle edema bilaterally Neuro: Moderate loss of vibratory sense at the fingertips bilaterally    Portacath/PICC-without erythema  Lab Results:  Lab Results  Component Value Date   WBC 6.5 09/21/2015   HGB 10.6* 09/21/2015   HCT 33.4* 09/21/2015   MCV 100.2* 09/21/2015   PLT 137* 09/21/2015   NEUTROABS 4.3 09/21/2015   CA 19-9 on 09/07/2015: [id number]  Medications: I have reviewed the patient's current medications.  Assessment/Plan: 1. Metastatic pancreas cancer  CT abdomen/pelvis 03/24/2015 confirmed a pancreas body mass with involvement of the celiac axis,SMV, and splenic vein. Multiple liver metastases  Ultrasound-guided biopsy of a right liver lesion on 04/06/2014 confirmed metastatic adenocarcinoma  Cycle 1 gemcitabine/Abraxane 04/20/2015  Cycle 2 gemcitabine/Abraxane 05/04/2015  Cycle 3 gemcitabine/Abraxane 05/18/2015  Cycle 4 gemcitabine/Abraxane 06/01/2015  Cycle 5 with gemcitabine alone 06/15/2015(Abraxane held due to progressive neuropathy symptoms)  Restaging CT 06/25/2015-slight enlargement of liver lesions, stable pancreas mass, slight decrease in the size of a gastrohepatic node  Cycle 6 gemcitabine/Abraxane 06/29/2015  Cycle 7 gemcitabine/Abraxane 07/13/2015  Cycle 8 gemcitabine/Abraxane  07/27/2015  Cycle 9 gemcitabine/Abraxane 08/10/2015  Cycle 10 gemcitabine/Abraxane 08/24/2015  CTs 09/04/2015-improvement in size of liver metastases, stable pancreas mass  Cycle 11 gemcitabine/Abraxane 09/07/2015  Cycle 12 gemcitabine/Abraxane 09/21/2015  2. Weight loss secondary to metastatic pancreas cancer versus diabetes  3. Diabetes  4. Peripheral neuropathy-predating chemotherapy  5. Right foot drop-improved, most likely related to peripheral nerve compression  6.   Bradycardia-EKG 09/21/2015 reveals a sinus rhythm with second-degree AV block, verapamil discontinued  Disposition:  [name] appears stable. He was noted to have bradycardia on physical exam today with a pulse in the 40s. An EKG reveals second-degree AV block. He appears asymptomatic. His systolic blood pressure has been low on repeat visits to the [facility]. I reviewed the EKG with cardiology. Lisinopril and verapamil will be discontinued.  The plan is to continue gemcitabine/Abraxane. [name] will return for an office visit in 2 weeks.  Betsy Coder, MD  09/21/2015  12:14 PM

## 2015-09-21 NOTE — Patient Instructions (Signed)

## 2015-09-21 NOTE — Patient Instructions (Signed)
[facility] Discharge Instructions for Patients Receiving Chemotherapy  Today you received the following chemotherapy agents: Abraxane, Gemzar  To help prevent nausea and vomiting after your treatment, we encourage you to take your nausea medication as prescribed by your physician.   If you develop nausea and vomiting that is not controlled by your nausea medication, call the clinic.   BELOW ARE SYMPTOMS THAT SHOULD BE REPORTED IMMEDIATELY:  *FEVER GREATER THAN 100.5 F  *CHILLS WITH OR WITHOUT FEVER  NAUSEA AND VOMITING THAT IS NOT CONTROLLED WITH YOUR NAUSEA MEDICATION  *UNUSUAL SHORTNESS OF BREATH  *UNUSUAL BRUISING OR BLEEDING  TENDERNESS IN MOUTH AND THROAT WITH OR WITHOUT PRESENCE OF ULCERS  *URINARY PROBLEMS  *BOWEL PROBLEMS  UNUSUAL RASH Items with * indicate a potential emergency and should be followed up as soon as possible.  Feel free to call the clinic you have any questions or concerns. The clinic phone number is [phone].  Please show the CHEMO ALERT CARD at check-in to the Emergency Department and triage nurse.   

## 2015-09-21 NOTE — Addendum Note (Signed)
Addended by: Brien Few on: 09/21/2015 12:37 PM   Modules accepted: Orders, Medications

## 2015-09-21 NOTE — Telephone Encounter (Signed)
Gave and pritned appt sched and avs for pt for July  °

## 2015-09-22 LAB — CANCER ANTIGEN 19-9

## 2015-10-05 ENCOUNTER — Ambulatory Visit (HOSPITAL_BASED_OUTPATIENT_CLINIC_OR_DEPARTMENT_OTHER): Payer: Medicare Other

## 2015-10-05 ENCOUNTER — Other Ambulatory Visit: Payer: Self-pay

## 2015-10-05 ENCOUNTER — Ambulatory Visit: Payer: Medicare Other

## 2015-10-05 ENCOUNTER — Other Ambulatory Visit (HOSPITAL_BASED_OUTPATIENT_CLINIC_OR_DEPARTMENT_OTHER): Payer: Medicare Other

## 2015-10-05 ENCOUNTER — Ambulatory Visit (HOSPITAL_BASED_OUTPATIENT_CLINIC_OR_DEPARTMENT_OTHER): Payer: Medicare Other | Admitting: Nurse Practitioner

## 2015-10-05 ENCOUNTER — Telehealth: Payer: Self-pay | Admitting: Oncology

## 2015-10-05 VITALS — BP 131/54 | HR 42 | Temp 98.3°F | Resp 18 | Ht 72.0 in | Wt 145.3 lb

## 2015-10-05 DIAGNOSIS — Z95828 Presence of other vascular implants and grafts: Secondary | ICD-10-CM

## 2015-10-05 DIAGNOSIS — E119 Type 2 diabetes mellitus without complications: Secondary | ICD-10-CM

## 2015-10-05 DIAGNOSIS — Z452 Encounter for adjustment and management of vascular access device: Secondary | ICD-10-CM

## 2015-10-05 DIAGNOSIS — C251 Malignant neoplasm of body of pancreas: Secondary | ICD-10-CM

## 2015-10-05 DIAGNOSIS — C787 Secondary malignant neoplasm of liver and intrahepatic bile duct: Secondary | ICD-10-CM

## 2015-10-05 DIAGNOSIS — I442 Atrioventricular block, complete: Secondary | ICD-10-CM

## 2015-10-05 DIAGNOSIS — G629 Polyneuropathy, unspecified: Secondary | ICD-10-CM

## 2015-10-05 DIAGNOSIS — R001 Bradycardia, unspecified: Secondary | ICD-10-CM

## 2015-10-05 LAB — CBC WITH DIFFERENTIAL/PLATELET
BASO%: 1.3 % (ref 0.0–2.0)
BASOS ABS: 0.1 10*3/uL (ref 0.0–0.1)
EOS ABS: 0.1 10*3/uL (ref 0.0–0.5)
EOS%: 1.8 % (ref 0.0–7.0)
HEMATOCRIT: 33.7 % — AB (ref 38.4–49.9)
HEMOGLOBIN: 11.1 g/dL — AB (ref 13.0–17.1)
LYMPH#: 1 10*3/uL (ref 0.9–3.3)
LYMPH%: 13.6 % — ABNORMAL LOW (ref 14.0–49.0)
MCH: 32.9 pg (ref 27.2–33.4)
MCHC: 32.8 g/dL (ref 32.0–36.0)
MCV: 100 fL — ABNORMAL HIGH (ref 79.3–98.0)
MONO#: 1 10*3/uL — AB (ref 0.1–0.9)
MONO%: 13.2 % (ref 0.0–14.0)
NEUT#: 5.3 10*3/uL (ref 1.5–6.5)
NEUT%: 70.1 % (ref 39.0–75.0)
PLATELETS: 132 10*3/uL — AB (ref 140–400)
RBC: 3.37 10*6/uL — ABNORMAL LOW (ref 4.20–5.82)
RDW: 16.1 % — AB (ref 11.0–14.6)
WBC: 7.5 10*3/uL (ref 4.0–10.3)

## 2015-10-05 LAB — COMPREHENSIVE METABOLIC PANEL
ALBUMIN: 3.4 g/dL — AB (ref 3.5–5.0)
ALK PHOS: 116 U/L (ref 40–150)
ALT: 69 U/L — ABNORMAL HIGH (ref 0–55)
ANION GAP: 9 meq/L (ref 3–11)
AST: 65 U/L — AB (ref 5–34)
BUN: 18.6 mg/dL (ref 7.0–26.0)
CALCIUM: 9.1 mg/dL (ref 8.4–10.4)
CHLORIDE: 102 meq/L (ref 98–109)
CO2: 25 mEq/L (ref 22–29)
CREATININE: 0.8 mg/dL (ref 0.7–1.3)
EGFR: 84 mL/min/{1.73_m2} — ABNORMAL LOW (ref >90)
Glucose: 261 mg/dl — ABNORMAL HIGH (ref 70–140)
POTASSIUM: 4.1 meq/L (ref 3.5–5.1)
Sodium: 136 mEq/L (ref 136–145)
Total Bilirubin: 0.57 mg/dL (ref 0.20–1.20)
Total Protein: 6.1 g/dL — ABNORMAL LOW (ref 6.4–8.3)

## 2015-10-05 MED ORDER — SODIUM CHLORIDE 0.9 % IJ SOLN
10.0000 mL | INTRAMUSCULAR | Status: DC | PRN
  Administered 2015-10-05: 10 mL via INTRAVENOUS
  Filled 2015-10-05: qty 10

## 2015-10-05 MED ORDER — HEPARIN SOD (PORK) LOCK FLUSH 100 UNIT/ML IV SOLN
500.0000 [IU] | Freq: Once | INTRAVENOUS | Status: AC | PRN
  Administered 2015-10-05: 500 [IU] via INTRAVENOUS
  Filled 2015-10-05: qty 5

## 2015-10-05 NOTE — Patient Instructions (Signed)

## 2015-10-05 NOTE — Telephone Encounter (Signed)
gve and printed appt sched and avs for pt for July

## 2015-10-05 NOTE — Progress Notes (Signed)
Called [facility]: pt worked in with Dr. Curt Bears 7/6 @ 0900.

## 2015-10-05 NOTE — Progress Notes (Signed)
[place] OFFICE PROGRESS NOTE   Diagnosis:  Pancreas cancer  INTERVAL HISTORY:   Mr. Spinner returns as scheduled. He completed cycle 12 gemcitabine/Abraxane 09/21/2015. He denies nausea/vomiting. No mouth sores. No diarrhea or constipation. He has intermittent "upper" gas discomfort. He is able to relieve the discomfort with walking or "gas tablets". No fever. No rash.  When he was here 2 weeks ago he was noted to be bradycardic with a heart rate in the 40s. Dr. Benay Spice spoke with cardiology. Lisinopril and verapamil discontinued. He reports his heart rate has ranged from the 30s to the low 40s over the past 2 weeks. He denies lightheadedness. No dizziness. No weakness. No shortness of breath. No chest pain. He further reports being referred to cardiology just prior to his cancer diagnosis for evaluation of bradycardia. He canceled the appointment due to the cancer diagnosis.   Objective:  Vital signs in last 24 hours:  Blood pressure 131/54, pulse 42, temperature 98.3 F (36.8 C), temperature source Oral, resp. rate 18, height 6' (1.829 m), weight 145 lb 4.8 oz (65.908 kg), SpO2 100 %. Repeat heart rate 48, 50    HEENT: No thrush or ulcers.  Resp: Lungs clear bilaterally. Cardio: Irregular, bradycardic. GI: Abdomen soft and nontender. No hepatomegaly. No mass. Vascular: Trace pitting edema at the low leg/ankles bilaterally.  Skin: No rash. Port-A-Cath without erythema.    Lab Results:  Lab Results  Component Value Date   WBC 7.5 10/05/2015   HGB 11.1* 10/05/2015   HCT 33.7* 10/05/2015   MCV 100.0* 10/05/2015   PLT 132* 10/05/2015   NEUTROABS 5.3 10/05/2015    Imaging:  No results found.  Medications: I have reviewed the patient's current medications.  Assessment/Plan: 1. Metastatic pancreas cancer  CT abdomen/pelvis 03/24/2015 confirmed a pancreas body mass with involvement of the celiac axis,SMV, and splenic vein. Multiple liver  metastases  Ultrasound-guided biopsy of a right liver lesion on 04/06/2014 confirmed metastatic adenocarcinoma  Cycle 1 gemcitabine/Abraxane 04/20/2015  Cycle 2 gemcitabine/Abraxane 05/04/2015  Cycle 3 gemcitabine/Abraxane 05/18/2015  Cycle 4 gemcitabine/Abraxane 06/01/2015  Cycle 5 with gemcitabine alone 06/15/2015(Abraxane held due to progressive neuropathy symptoms)  Restaging CT 06/25/2015-slight enlargement of liver lesions, stable pancreas mass, slight decrease in the size of a gastrohepatic node  Cycle 6 gemcitabine/Abraxane 06/29/2015  Cycle 7 gemcitabine/Abraxane 07/13/2015  Cycle 8 gemcitabine/Abraxane 07/27/2015  Cycle 9 gemcitabine/Abraxane 08/10/2015  Cycle 10 gemcitabine/Abraxane 08/24/2015  CTs 09/04/2015-improvement in size of liver metastases, stable pancreas mass  Cycle 11 gemcitabine/Abraxane 09/07/2015  Cycle 12 gemcitabine/Abraxane 09/21/2015  2. Weight loss secondary to metastatic pancreas cancer versus diabetes  3. Diabete  4. Peripheral neuropathy-predating chemotherapy  5. Right foot drop-improved, most likely related to peripheral nerve compression  6. Bradycardia-EKG 09/21/2015 reveals a sinus rhythm with second-degree AV block, verapamil discontinued; reading by cardiologist 09/21/2015 shows complete heart block new since previous tracing. Repeat EKG today shows first-degree AV block.   Disposition: Mr. Mear appears stable. He has completed 12 cycles of gemcitabine/Abraxane. Restaging CT scans last month showed improvement in the liver metastases.   Mr. Giamanco has persistent bradycardia with an EKG 2 weeks ago showing complete heart block. Today the EKG shows first-degree AV block. I reviewed the above with Dr. Burr Medico in Dr. Gearldine Shown absence. We decided to hold today's treatment and refer to cardiology. Of note, he appears asymptomatic.  He will return for a follow-up visit and chemotherapy in 2 weeks. He will contact the office in  the interim with any problems.  Plan reviewed with Dr. Burr Medico. 25 minutes were spent face-to-face at today's visit with the majority of that time involved in counseling/coordination of care.    Ned Card ANP/GNP-BC   10/05/2015  9:35 AM

## 2015-10-06 LAB — CANCER ANTIGEN 19-9: CA 19-9: 71106 U/mL — ABNORMAL HIGH (ref 0–35)

## 2015-10-08 ENCOUNTER — Ambulatory Visit: Payer: Federal, State, Local not specified - PPO | Admitting: Cardiology

## 2015-10-09 ENCOUNTER — Encounter: Payer: Self-pay | Admitting: Cardiology

## 2015-10-09 ENCOUNTER — Ambulatory Visit (INDEPENDENT_AMBULATORY_CARE_PROVIDER_SITE_OTHER): Payer: Medicare Other | Admitting: Cardiology

## 2015-10-09 VITALS — BP 132/60 | HR 46 | Ht 72.0 in | Wt 144.6 lb

## 2015-10-09 DIAGNOSIS — R001 Bradycardia, unspecified: Secondary | ICD-10-CM | POA: Diagnosis not present

## 2015-10-09 NOTE — Patient Instructions (Signed)
Medication Instructions:  Your physician recommends that you continue on your current medications as directed. Please refer to the Current Medication list given to you today.  Labwork: None ordered  Testing/Procedures: None ordered  Follow-Up: Your physician wants you to follow-up in: 6 months with Dr. Curt Bears. You will receive a reminder letter in the mail two months in advance. If you don't receive a letter, please call our office to schedule the follow-up appointment.  If you need a refill on your cardiac medications before your next appointment, please call your pharmacy.  Thank you for choosing [facility]!!   Trinidad Curet, RN [phone]    c

## 2015-10-09 NOTE — Progress Notes (Signed)
Electrophysiology Office Note   Date:  10/09/2015   ID:  [name], DOB [birth year], MRN [id number]  PCP:  Gennette Pac, MD  Primary Electrophysiologist:  Constance Haw, MD    Chief Complaint  Patient presents with  . New Patient (Initial Visit)  . Bradycardia     History of Present Illness: [name] is a 82 y.o. male who presents today for electrophysiology evaluation.   History of pancreatic cancer, DM, HTN, HLD.  Was found to be bradycardic and verapamil was stopped due to second degree AV block.  Subsequently had an ECG showing complete AV block then another with sinus rhythm and first degree AV block.  Today, EKG shows that he is in normal rhythm with a PR interval of 200 and occasional PVCs. He is continuing to undergo chemotherapy for his pancreatic cancer and has fatigue due to that.   Today, he denies symptoms of palpitations, chest pain, shortness of breath, orthopnea, PND, lower extremity edema, claudication, dizziness, presyncope, syncope, bleeding, or neurologic sequela. The patient is tolerating medications without difficulties and is otherwise without complaint today.    Past Medical History  Diagnosis Date  . Diabetes mellitus without complication ([place])   . Hypertension   . High cholesterol   . Gout   . Liver mass   . Pancreatic mass    Past Surgical History  Procedure Laterality Date  . Abdominal aortic aneurysm repair    . Hernia repair      abdominal      Current Outpatient Prescriptions  Medication Sig Dispense Refill  . allopurinol (ZYLOPRIM) 300 MG tablet Take 300 mg by mouth daily.  0  . canagliflozin (INVOKANA) 100 MG TABS tablet Take 100 mg by mouth daily before breakfast.    . glimepiride (AMARYL) 2 MG tablet Take 4 mg by mouth 2 (two) times daily.  1  . pravastatin (PRAVACHOL) 80 MG tablet Take 80 mg by mouth daily.  0  . ZETIA 10 MG tablet Take 10 mg by mouth daily.     No current facility-administered medications for  this visit.    Allergies:   Metformin and related   Social History:  The patient  reports that he has quit smoking. He quit smokeless tobacco use about 47 years ago. He reports that he does not drink alcohol or use illicit drugs.   Family History:  The patient's family history includes COPD in his mother; Heart attack in his maternal grandmother; Stroke in his maternal grandfather and maternal grandmother.    ROS:  Please see the history of present illness.   Otherwise, review of systems is positive for none.   All other systems are reviewed and negative.    PHYSICAL EXAM: VS:  BP 132/60 mmHg  Pulse 46  Ht 6' (1.829 m)  Wt 144 lb 9.6 oz (65.59 kg)  BMI 19.61 kg/m2 , BMI Body mass index is 19.61 kg/(m^2). GEN: Well nourished, well developed, in no acute distress HEENT: normal Neck: no JVD, carotid bruits, or masses Cardiac: RRR; no murmurs, rubs, or gallops,no edema  Respiratory:  clear to auscultation bilaterally, normal work of breathing GI: soft, nontender, nondistended, + BS MS: no deformity or atrophy Skin: warm and dry Neuro:  Strength and sensation are intact Psych: euthymic mood, full affect  EKG:  EKG is ordered today. The ekg ordered today shows sinus rhythm, rate 70, PVCs  Recent Labs: 10/05/2015: ALT 69*; BUN 18.6; Creatinine 0.8; HGB 11.1*; Platelets 132*; Potassium 4.1;  Sodium 136    Lipid Panel  No results found for: CHOL, TRIG, HDL, CHOLHDL, VLDL, LDLCALC, LDLDIRECT   Wt Readings from Last 3 Encounters:  10/09/15 144 lb 9.6 oz (65.59 kg)  10/05/15 145 lb 4.8 oz (65.908 kg)  09/21/15 146 lb 11.2 oz (66.543 kg)      Other studies Reviewed: Additional studies/ records that were reviewed today include: Epic notes  ASSESSMENT AND PLAN:  1.  Complete AV block: was on verapamil but has since been stopped. Currently, he is in normal rhythm with a first-degree AV block and occasional PVCs. At this time he does not need a pacemaker. We'll continue to monitor  him. I have told him that if he has extreme fatigue or lethargy, that he should call the office and have an EKG done at that time.    Current medicines are reviewed at length with the patient today.   The patient does not have concerns regarding his medicines.  The following changes were made today:  none  Labs/ tests ordered today include:  Orders Placed This Encounter  Procedures  . EKG 12-Lead     Disposition:   FU with Will Camnitz 6 months  Signed, Will Meredith Leeds, MD  10/09/2015 9:58 AM     [facility] [address] [place] [address] [phone] (office) [phone] (fax)

## 2015-10-17 ENCOUNTER — Other Ambulatory Visit: Payer: Self-pay | Admitting: Oncology

## 2015-10-19 ENCOUNTER — Ambulatory Visit: Payer: Medicare Other

## 2015-10-19 ENCOUNTER — Ambulatory Visit (HOSPITAL_COMMUNITY)
Admission: RE | Admit: 2015-10-19 | Discharge: 2015-10-19 | Disposition: A | Discharge status: Home / Self Care | Payer: Medicare Other | Source: Ambulatory Visit | Attending: Oncology | Admitting: Oncology

## 2015-10-19 ENCOUNTER — Telehealth: Payer: Self-pay | Admitting: *Deleted

## 2015-10-19 ENCOUNTER — Encounter (HOSPITAL_COMMUNITY): Payer: Self-pay

## 2015-10-19 ENCOUNTER — Other Ambulatory Visit (HOSPITAL_BASED_OUTPATIENT_CLINIC_OR_DEPARTMENT_OTHER): Payer: Medicare Other

## 2015-10-19 ENCOUNTER — Ambulatory Visit (HOSPITAL_BASED_OUTPATIENT_CLINIC_OR_DEPARTMENT_OTHER): Payer: Medicare Other | Admitting: Oncology

## 2015-10-19 VITALS — BP 144/52 | HR 60 | Temp 97.8°F | Resp 18 | Ht 72.0 in | Wt 137.8 lb

## 2015-10-19 DIAGNOSIS — C251 Malignant neoplasm of body of pancreas: Secondary | ICD-10-CM

## 2015-10-19 DIAGNOSIS — I440 Atrioventricular block, first degree: Secondary | ICD-10-CM

## 2015-10-19 DIAGNOSIS — Z452 Encounter for adjustment and management of vascular access device: Secondary | ICD-10-CM | POA: Diagnosis not present

## 2015-10-19 DIAGNOSIS — I700 Atherosclerosis of aorta: Secondary | ICD-10-CM | POA: Diagnosis not present

## 2015-10-19 DIAGNOSIS — C787 Secondary malignant neoplasm of liver and intrahepatic bile duct: Secondary | ICD-10-CM

## 2015-10-19 DIAGNOSIS — G629 Polyneuropathy, unspecified: Secondary | ICD-10-CM | POA: Diagnosis not present

## 2015-10-19 DIAGNOSIS — K838 Other specified diseases of biliary tract: Secondary | ICD-10-CM | POA: Diagnosis not present

## 2015-10-19 DIAGNOSIS — I714 Abdominal aortic aneurysm, without rupture: Secondary | ICD-10-CM | POA: Diagnosis not present

## 2015-10-19 DIAGNOSIS — R17 Unspecified jaundice: Secondary | ICD-10-CM

## 2015-10-19 DIAGNOSIS — I2510 Atherosclerotic heart disease of native coronary artery without angina pectoris: Secondary | ICD-10-CM | POA: Diagnosis not present

## 2015-10-19 DIAGNOSIS — Z95828 Presence of other vascular implants and grafts: Secondary | ICD-10-CM

## 2015-10-19 DIAGNOSIS — E119 Type 2 diabetes mellitus without complications: Secondary | ICD-10-CM | POA: Diagnosis not present

## 2015-10-19 LAB — CBC WITH DIFFERENTIAL/PLATELET
BASO%: 1.3 % (ref 0.0–2.0)
Basophils Absolute: 0.1 10e3/uL (ref 0.0–0.1)
EOS%: 0.9 % (ref 0.0–7.0)
Eosinophils Absolute: 0.1 10e3/uL (ref 0.0–0.5)
HCT: 35.9 % — ABNORMAL LOW (ref 38.4–49.9)
HGB: 11.7 g/dL — ABNORMAL LOW (ref 13.0–17.1)
LYMPH%: 10.4 % — ABNORMAL LOW (ref 14.0–49.0)
MCH: 32.6 pg (ref 27.2–33.4)
MCHC: 32.6 g/dL (ref 32.0–36.0)
MCV: 100 fL — ABNORMAL HIGH (ref 79.3–98.0)
MONO#: 0.9 10e3/uL (ref 0.1–0.9)
MONO%: 10 % (ref 0.0–14.0)
NEUT#: 7 10e3/uL — ABNORMAL HIGH (ref 1.5–6.5)
NEUT%: 77.4 % — ABNORMAL HIGH (ref 39.0–75.0)
Platelets: 162 10e3/uL (ref 140–400)
RBC: 3.59 10e6/uL — ABNORMAL LOW (ref 4.20–5.82)
RDW: 16 % — ABNORMAL HIGH (ref 11.0–14.6)
WBC: 9.1 10e3/uL (ref 4.0–10.3)
lymph#: 0.9 10e3/uL (ref 0.9–3.3)

## 2015-10-19 LAB — COMPREHENSIVE METABOLIC PANEL
ALBUMIN: 3.2 g/dL — AB (ref 3.5–5.0)
ALK PHOS: 456 U/L — AB (ref 40–150)
ALT: 372 U/L — AB (ref 0–55)
AST: 295 U/L — AB (ref 5–34)
Anion Gap: 9 mEq/L (ref 3–11)
BUN: 18 mg/dL (ref 7.0–26.0)
CALCIUM: 9.3 mg/dL (ref 8.4–10.4)
CHLORIDE: 103 meq/L (ref 98–109)
CO2: 25 mEq/L (ref 22–29)
CREATININE: 0.9 mg/dL (ref 0.7–1.3)
EGFR: 81 mL/min/{1.73_m2} — ABNORMAL LOW (ref >90)
Glucose: 250 mg/dl — ABNORMAL HIGH (ref 70–140)
Potassium: 3.9 mEq/L (ref 3.5–5.1)
Sodium: 137 mEq/L (ref 136–145)
TOTAL PROTEIN: 6 g/dL — AB (ref 6.4–8.3)
Total Bilirubin: 9.03 mg/dL (ref 0.20–1.20)

## 2015-10-19 MED ORDER — SODIUM CHLORIDE 0.9 % IJ SOLN
10.0000 mL | INTRAMUSCULAR | Status: DC | PRN
  Administered 2015-10-19: 10 mL via INTRAVENOUS
  Filled 2015-10-19: qty 10

## 2015-10-19 MED ORDER — SODIUM CHLORIDE 0.9% FLUSH
10.0000 mL | INTRAVENOUS | Status: AC | PRN
  Administered 2015-10-19: 10 mL via INTRAVENOUS
  Filled 2015-10-19: qty 10

## 2015-10-19 MED ORDER — HEPARIN SOD (PORK) LOCK FLUSH 100 UNIT/ML IV SOLN
500.0000 [IU] | Freq: Once | INTRAVENOUS | Status: AC
  Administered 2015-10-19: 500 [IU] via INTRAVENOUS
  Filled 2015-10-19: qty 5

## 2015-10-19 MED ORDER — IOPAMIDOL (ISOVUE-300) INJECTION 61%
100.0000 mL | Freq: Once | INTRAVENOUS | Status: AC | PRN
  Administered 2015-10-19: 100 mL via INTRAVENOUS

## 2015-10-19 NOTE — Addendum Note (Signed)
Addended by: Clifton James D on: 10/19/2015 10:40 AM   Modules accepted: Orders

## 2015-10-19 NOTE — Progress Notes (Signed)
Pt returned to office for port de-access. Site unremarkable.

## 2015-10-19 NOTE — Telephone Encounter (Signed)
Call from pt's wife: Pt's port was left accessed after CT. Instructed her to come back to office for port de-access. Informed Mrs. Lupien that Dr. Benay Spice discussed things with Dr. Watt Climes. GI office will contact pt with appt.

## 2015-10-19 NOTE — Addendum Note (Signed)
Addended by: Brien Few on: 10/19/2015 02:47 PM   Modules accepted: Orders, SmartSet

## 2015-10-19 NOTE — Progress Notes (Signed)
Late entry for 1020: Dr. Benay Spice made aware of elevated bilirubin and liver enzymes. Pt in for office visit, worked in for STAT CT.

## 2015-10-19 NOTE — Progress Notes (Signed)
  [name] OFFICE PROGRESS NOTE   Diagnosis: Pancreas cancer  INTERVAL HISTORY:   [name] returns as scheduled. He was last treated with gemcitabine/Abraxane on 09/21/2015. He has noted "gold "colored urine for the past few weeks. He has "gas "-like discomfort in the upper abdomen. His appetite is diminished. No change in neuropathy symptoms. He has not been walking secondary to the hot weather. He saw cardiology for evaluation of the bradycardia and was diagnosed with complete AV block when he was on verapamil. He had first-degree A-V block with occasional PVCs when he saw cardiology on 10/09/2015. A pacemaker was not recommended.   Objective:  Vital signs in last 24 hours:  Blood pressure 144/52, pulse 60, temperature 97.8 F (36.6 C), resp. rate 18, height 6' (1.829 m), weight 137 lb 12.8 oz (62.506 kg), SpO2 99 %.    HEENT: Scleral icterus. Resp: Lungs clear bilaterally Cardio: Irregular, manual pulse 60-64 GI: No hepatomegaly, nontender Vascular: No leg edema  Skin: Jaundice   Portacath/PICC-without erythema  Lab Results:  Lab Results  Component Value Date   WBC 9.1 10/19/2015   HGB 11.7* 10/19/2015   HCT 35.9* 10/19/2015   MCV 100.0* 10/19/2015   PLT 162 10/19/2015   NEUTROABS 7.0* 10/19/2015  Alkaline phosphatase 456, AST to 95, ALT 372, bilirubin 9.03   CA 19-9 on 10/05/2015-71,106  Medications: I have reviewed the patient's current medications.  Assessment/Plan: 1. Metastatic pancreas cancer  CT abdomen/pelvis 03/24/2015 confirmed a pancreas body mass with involvement of the celiac axis,SMV, and splenic vein. Multiple liver metastases  Ultrasound-guided biopsy of a right liver lesion on 04/06/2014 confirmed metastatic adenocarcinoma  Cycle 1 gemcitabine/Abraxane 04/20/2015  Cycle 2 gemcitabine/Abraxane 05/04/2015  Cycle 3 gemcitabine/Abraxane 05/18/2015  Cycle 4 gemcitabine/Abraxane 06/01/2015  Cycle 5 with gemcitabine alone  06/15/2015(Abraxane held due to progressive neuropathy symptoms)  Restaging CT 06/25/2015-slight enlargement of liver lesions, stable pancreas mass, slight decrease in the size of a gastrohepatic node  Cycle 6 gemcitabine/Abraxane 06/29/2015  Cycle 7 gemcitabine/Abraxane 07/13/2015  Cycle 8 gemcitabine/Abraxane 07/27/2015  Cycle 9 gemcitabine/Abraxane 08/10/2015  Cycle 10 gemcitabine/Abraxane 08/24/2015  CTs 09/04/2015-improvement in size of liver metastases, stable pancreas mass  Cycle 11 gemcitabine/Abraxane 09/07/2015  Cycle 12 gemcitabine/Abraxane 09/21/2015  2. Weight loss secondary to metastatic pancreas cancer versus diabetes  3. Diabete  4. Peripheral neuropathy-predating chemotherapy  5. Right foot drop-improved, most likely related to peripheral nerve compression  6. Bradycardia-EKG 09/21/2015 reveals a sinus rhythm with second-degree AV block, verapamil discontinued; reading by cardiologist 09/21/2015 shows complete heart block new since previous tracing. Repeat EKG today shows first-degree AV block. Evaluated by cardiology.  7.   New-onset jaundice 10/19/2015-likely secondary to biliary obstruction by tumor. He will be referred for an urgent CT abdomen.   Disposition:  [name] has developed jaundice. I suspect this is secondary to biliary obstruction. He will be referred for a CT today. I will contact Dr. Watt Climes if there is CT evidence of biliary obstruction.  I am concerned the pancreas cancer is progressing as evidenced by the anorexia and hyperbilirubinemia.  Chase Coder, MD  10/19/2015  10:16 AM

## 2015-10-19 NOTE — Patient Instructions (Signed)

## 2015-10-20 ENCOUNTER — Encounter (HOSPITAL_COMMUNITY): Payer: Self-pay | Admitting: *Deleted

## 2015-10-20 LAB — CANCER ANTIGEN 19-9: CA 19-9: 109625 U/mL — ABNORMAL HIGH (ref 0–35)

## 2015-10-21 ENCOUNTER — Other Ambulatory Visit: Payer: Self-pay | Admitting: Gastroenterology

## 2015-10-22 ENCOUNTER — Ambulatory Visit (HOSPITAL_COMMUNITY): Payer: Medicare Other

## 2015-10-22 ENCOUNTER — Encounter (HOSPITAL_COMMUNITY)
Admission: RE | Disposition: A | Discharge status: Home / Self Care | Payer: Self-pay | Source: Ambulatory Visit | Attending: Gastroenterology

## 2015-10-22 ENCOUNTER — Ambulatory Visit (HOSPITAL_COMMUNITY)
Admission: RE | Admit: 2015-10-22 | Discharge: 2015-10-22 | Disposition: A | Discharge status: Home / Self Care | Payer: Medicare Other | Source: Ambulatory Visit | Attending: Gastroenterology | Admitting: Gastroenterology

## 2015-10-22 ENCOUNTER — Encounter (HOSPITAL_COMMUNITY): Payer: Self-pay

## 2015-10-22 ENCOUNTER — Ambulatory Visit (HOSPITAL_COMMUNITY): Payer: Medicare Other | Admitting: Anesthesiology

## 2015-10-22 DIAGNOSIS — Z7984 Long term (current) use of oral hypoglycemic drugs: Secondary | ICD-10-CM | POA: Insufficient documentation

## 2015-10-22 DIAGNOSIS — I10 Essential (primary) hypertension: Secondary | ICD-10-CM | POA: Diagnosis not present

## 2015-10-22 DIAGNOSIS — E785 Hyperlipidemia, unspecified: Secondary | ICD-10-CM | POA: Diagnosis not present

## 2015-10-22 DIAGNOSIS — Z87891 Personal history of nicotine dependence: Secondary | ICD-10-CM | POA: Insufficient documentation

## 2015-10-22 DIAGNOSIS — Z79899 Other long term (current) drug therapy: Secondary | ICD-10-CM | POA: Insufficient documentation

## 2015-10-22 DIAGNOSIS — E119 Type 2 diabetes mellitus without complications: Secondary | ICD-10-CM | POA: Insufficient documentation

## 2015-10-22 DIAGNOSIS — C250 Malignant neoplasm of head of pancreas: Secondary | ICD-10-CM | POA: Diagnosis not present

## 2015-10-22 DIAGNOSIS — K5710 Diverticulosis of small intestine without perforation or abscess without bleeding: Secondary | ICD-10-CM | POA: Diagnosis not present

## 2015-10-22 DIAGNOSIS — R932 Abnormal findings on diagnostic imaging of liver and biliary tract: Secondary | ICD-10-CM | POA: Diagnosis present

## 2015-10-22 DIAGNOSIS — R945 Abnormal results of liver function studies: Secondary | ICD-10-CM

## 2015-10-22 DIAGNOSIS — R7989 Other specified abnormal findings of blood chemistry: Secondary | ICD-10-CM

## 2015-10-22 DIAGNOSIS — M109 Gout, unspecified: Secondary | ICD-10-CM | POA: Diagnosis not present

## 2015-10-22 DIAGNOSIS — K831 Obstruction of bile duct: Secondary | ICD-10-CM | POA: Diagnosis not present

## 2015-10-22 HISTORY — DX: Personal history of urinary calculi: Z87.442

## 2015-10-22 HISTORY — PX: ERCP: SHX5425

## 2015-10-22 HISTORY — DX: Malignant (primary) neoplasm, unspecified: C80.1

## 2015-10-22 LAB — GLUCOSE, CAPILLARY: Glucose-Capillary: 143 mg/dL — ABNORMAL HIGH (ref 65–99)

## 2015-10-22 SURGERY — ERCP, WITH INTERVENTION IF INDICATED
Anesthesia: General

## 2015-10-22 MED ORDER — CEFOTETAN DISODIUM-DEXTROSE 2-2.08 GM-% IV SOLR
2.0000 g | Freq: Once | INTRAVENOUS | Status: AC
  Administered 2015-10-22: 2 g via INTRAVENOUS
  Filled 2015-10-22: qty 50

## 2015-10-22 MED ORDER — SUCCINYLCHOLINE CHLORIDE 20 MG/ML IJ SOLN
INTRAMUSCULAR | Status: DC | PRN
  Administered 2015-10-22: 60 mg via INTRAVENOUS

## 2015-10-22 MED ORDER — PROPOFOL 10 MG/ML IV BOLUS
INTRAVENOUS | Status: DC | PRN
  Administered 2015-10-22: 100 mg via INTRAVENOUS

## 2015-10-22 MED ORDER — EPHEDRINE SULFATE 50 MG/ML IJ SOLN
INTRAMUSCULAR | Status: DC | PRN
  Administered 2015-10-22: 5 mg via INTRAVENOUS

## 2015-10-22 MED ORDER — LACTATED RINGERS IV SOLN
INTRAVENOUS | Status: DC
  Administered 2015-10-22: 11:00:00 via INTRAVENOUS
  Administered 2015-10-22: 1000 mL via INTRAVENOUS

## 2015-10-22 MED ORDER — SODIUM CHLORIDE 0.9 % IV SOLN: INTRAVENOUS | Status: DC

## 2015-10-22 MED ORDER — GLUCAGON HCL RDNA (DIAGNOSTIC) 1 MG IJ SOLR
INTRAMUSCULAR | Status: AC
  Filled 2015-10-22: qty 1

## 2015-10-22 MED ORDER — ONDANSETRON HCL 4 MG/2ML IJ SOLN
INTRAMUSCULAR | Status: DC | PRN
  Administered 2015-10-22: 4 mg via INTRAVENOUS

## 2015-10-22 MED ORDER — SODIUM CHLORIDE 0.9 % IV SOLN
INTRAVENOUS | Status: DC | PRN
  Administered 2015-10-22: 40 mL

## 2015-10-22 MED ORDER — FENTANYL CITRATE (PF) 100 MCG/2ML IJ SOLN
INTRAMUSCULAR | Status: DC | PRN
  Administered 2015-10-22: 100 ug via INTRAVENOUS

## 2015-10-22 MED ORDER — LIDOCAINE HCL (CARDIAC) 20 MG/ML IV SOLN
INTRAVENOUS | Status: DC | PRN
  Administered 2015-10-22: 50 mg via INTRAVENOUS

## 2015-10-22 NOTE — Anesthesia Postprocedure Evaluation (Signed)
Anesthesia Post Note  Patient: [name]  Procedure(s) Performed: Procedure(s) (LRB): ENDOSCOPIC RETROGRADE CHOLANGIOPANCREATOGRAPHY (ERCP) (N/A)  Patient location during evaluation: PACU Anesthesia Type: General Level of consciousness: awake and alert Pain management: pain level controlled Vital Signs Assessment: post-procedure vital signs reviewed and stable Respiratory status: spontaneous breathing, nonlabored ventilation, respiratory function stable and patient connected to nasal cannula oxygen Cardiovascular status: blood pressure returned to baseline and stable Postop Assessment: no signs of nausea or vomiting Anesthetic complications: no    Last Vitals:  Filed Vitals:   10/22/15 1240 10/22/15 1248  BP: 201/167 198/89  Pulse: 62 66  Temp:    Resp: 21 19    Last Pain:  Filed Vitals:   10/22/15 1248  PainSc: 0-No pain                 Effie Berkshire

## 2015-10-22 NOTE — Progress Notes (Signed)
Received report from recovery RN at 1240 pm. Pt was found to be very hypertensive, not complaining of any pain or discomfort. I reviewed patient's pre-op BP and decided to take his blood pressure manually. While he was still hypertensive, his MAP was closer to baseline. Patient and wife informed me that he was just recently taken off a couple of blood pressure medications per his cardiologist. I insisted that they follow-up with his cardiologist as soon as possible.

## 2015-10-22 NOTE — Transfer of Care (Signed)
Immediate Anesthesia Transfer of Care Note  Patient: [name]  Procedure(s) Performed: Procedure(s): ENDOSCOPIC RETROGRADE CHOLANGIOPANCREATOGRAPHY (ERCP) (N/A)  Patient Location: PACU  Anesthesia Type:General  Level of Consciousness: sedated, patient cooperative and responds to stimulation  Airway & Oxygen Therapy: Patient Spontanous Breathing and Patient connected to face mask oxygen  Post-op Assessment: Report given to RN and Post -op Vital signs reviewed and stable  Post vital signs: Reviewed and stable  Last Vitals:  Filed Vitals:   10/22/15 0825  BP: 149/100  Pulse: 79  Temp: 36.6 C  Resp: 22    Last Pain: There were no vitals filed for this visit.       Complications: No apparent anesthesia complications

## 2015-10-22 NOTE — Anesthesia Procedure Notes (Signed)
Procedure Name: Intubation Performed by: Tinsleigh Slovacek J Pre-anesthesia Checklist: Patient identified, Emergency Drugs available, Suction available, Patient being monitored and Timeout performed Patient Re-evaluated:Patient Re-evaluated prior to inductionOxygen Delivery Method: Circle system utilized Preoxygenation: Pre-oxygenation with 100% oxygen Intubation Type: IV induction Ventilation: Mask ventilation without difficulty Laryngoscope Size: Mac and 4 Grade View: Grade I Tube type: Oral Tube size: 7.5 mm Number of attempts: 1 Airway Equipment and Method: Stylet Placement Confirmation: ETT inserted through vocal cords under direct vision,  positive ETCO2,  CO2 detector and breath sounds checked- equal and bilateral Secured at: 22 cm Tube secured with: Tape Dental Injury: Teeth and Oropharynx as per pre-operative assessment        

## 2015-10-22 NOTE — Discharge Instructions (Addendum)
Call if question or problem or particularly increased pain nausea vomiting fever or signs of bleeding otherwise clear liquid diet only until 6 PM and if doing well may have soft solids tonight and then slowly advance tomorrow and follow-up as needed or next week to repeat lab work and check on you.  Gastrointestinal Endoscopy, Care After Refer to this sheet in the next few weeks. These instructions provide you with information on caring for yourself after your procedure. Your caregiver may also give you more specific instructions. Your treatment has been planned according to current medical practices, but problems sometimes occur. Call your caregiver if you have any problems or questions after your procedure. HOME CARE INSTRUCTIONS  If you were given medicine to help you relax (sedative), do not drive, operate machinery, or sign important documents for 24 hours.  Avoid alcohol and hot or warm beverages for the first 24 hours after the procedure.  Only take over-the-counter or prescription medicines for pain, discomfort, or fever as directed by your caregiver. You may resume taking your normal medicines unless your caregiver tells you otherwise. Ask your caregiver when you may resume taking medicines that may cause bleeding, such as aspirin, clopidogrel, or warfarin.  You may return to your normal diet and activities on the day after your procedure, or as directed by your caregiver. Walking may help to reduce any bloated feeling in your abdomen.  Drink enough fluids to keep your urine clear or pale yellow.  You may gargle with salt water if you have a sore throat. SEEK IMMEDIATE MEDICAL CARE IF:  You have severe nausea or vomiting.  You have severe abdominal pain, abdominal cramps that last longer than 6 hours, or abdominal swelling (distention).  You have severe shoulder or back pain.  You have trouble swallowing.  You have shortness of breath, your breathing is shallow, or you are  breathing faster than normal.  You have a fever or a rapid heartbeat.  You vomit blood or material that looks like coffee grounds.  You have bloody, black, or tarry stools. MAKE SURE YOU:  Understand these instructions.  Will watch your condition.  Will get help right away if you are not doing well or get worse.   This information is not intended to replace advice given to you by your health care provider. Make sure you discuss any questions you have with your health care provider.   Document Released: 11/03/2003 Document Revised: 04/11/2014 Document Reviewed: 06/21/2011 Elsevier Interactive Patient Education [employer].

## 2015-10-22 NOTE — Op Note (Signed)
[facility] Patient Name: [name] Procedure Date: 10/22/2015 MRN: [id number] Attending MD: Clarene Essex , MD Date of Birth: [birth year] CSN: [id number] Age: 82 Admit Type: Outpatient Procedure:                ERCP Indications:              Malignant tumor of the head of pancreas Providers:                Clarene Essex, MD, Cleda Daub, RN, [facility], Technician, Herbie Drape, CRNA Referring MD:              Medicines:                General Anesthesia Complications:            No immediate complications. Estimated Blood Loss:     Estimated blood loss: none. Procedure:                Pre-Anesthesia Assessment:                           - Prior to the procedure, a History and Physical                            was performed, and patient medications and                            allergies were reviewed. The patient's tolerance of                            previous anesthesia was also reviewed. The risks                            and benefits of the procedure and the sedation                            options and risks were discussed with the patient.                            All questions were answered, and informed consent                            was obtained. Prior Anticoagulants: The patient has                            taken no previous anticoagulant or antiplatelet                            agents. ASA Grade Assessment: III - A patient with                            severe systemic disease. After reviewing the risks  and benefits, the patient was deemed in                            satisfactory condition to undergo the procedure.                           After obtaining informed consent, the scope was                            passed under direct vision. Throughout the                            procedure, the patient's blood pressure, pulse, and                            oxygen  saturations were monitored continuously. The                            [id number]) scope was introduced through                            the mouth, and used to inject contrast into and                            used to locate the major papilla. The ERCP was                            technically difficult and complex due to abnormal                            anatomy. The patient tolerated the procedure well. Scope In: Scope Out: Findings:      The major papilla was normal except for a small duodenal diverticulum.       we initially tried to cannulate with the regular sphincterotome and then       changed to the smaller sphincterotome and smaller wire and we did inject       some dye which initially we thought was the pancreatic duct and when we       advanced the wire deep into the duct with the smaller sphincterotome in       an effort to place pancreatic stent intervention transverse upwards       towards the bile ducts and we followed it with the sphincterotome and I       was injected and showed a long probably 6 cm stricture and a dilated       proximal bile duct and we then proceeded with our Biliary sphincterotomy       was made with a Hydratome sphincterotome using ERBE electrocautery in       the customary fashion until he had some biliary drainage and could get       the fully bowed sphincterotome in and out of the duct. There was no       post-sphincterotomy bleeding. The lower third of the main bile duct,       middle third of the main bile duct and common bile duct contained a  single segmental stenosis. One 10 Fr by 8 cm covered metal stent was       placed into the common bile duct. Bile flowed through the stent. The       stent was in good position. retrospectively there was no pancreatic duct       injection or wire advancement throughout the procedure the patient       tolerated the procedure well Impression:               - The major papilla appeared  normal. small                            periampullary diverticulum and some narrowing of                            the distal bulb was seen as well                           - A long segmental biliary stricture was found. The                            stricture was malignant appearing. This stricture                            was treated with biliary sphincterotomy and                            stenting.                           - A sphincterotomy was performed.                           - One covered metal stent was placed into the                            common bile duct. Moderate Sedation:      N/A- Per Anesthesia Care Recommendation:           - Avoid aspirin and nonsteroidal anti-inflammatory                            medicines for 5 days.                           - Clear liquid diet today.                           - Continue present medications.                           - Return to GI clinic PRN.                           - Telephone GI clinic if symptomatic PRN. recheck  liver tests in 1 week Procedure Code(s):        --- Professional ---                           [phone], Esophagogastroduodenoscopy, flexible,                            transoral; diagnostic, including collection of                            specimen(s) by brushing or washing, when performed                            (separate procedure) Diagnosis Code(s):        --- Professional ---                           K83.1, Obstruction of bile duct                           C25.0, Malignant neoplasm of head of pancreas CPT copyright 2016 American Medical Association. All rights reserved. The codes documented in this report are preliminary and upon coder review may  be revised to meet current compliance requirements. Clarene Essex, MD 10/22/2015 11:29:44 AM This report has been signed electronically. Number of Addenda: 0

## 2015-10-22 NOTE — Anesthesia Preprocedure Evaluation (Addendum)
Anesthesia Evaluation  Patient identified by MRN, date of birth, ID band Patient awake    Reviewed: Allergy & Precautions, NPO status , Patient's Chart, lab work & pertinent test results  Airway Mallampati: I  TM Distance: >3 FB Neck ROM: Full    Dental  (+) Teeth Intact   Pulmonary former smoker,    breath sounds clear to auscultation       Cardiovascular hypertension,  Rhythm:Regular Rate:Normal     Neuro/Psych negative neurological ROS  negative psych ROS   GI/Hepatic negative GI ROS, Neg liver ROS,   Endo/Other  diabetes, Type 2, Oral Hypoglycemic Agents  Renal/GU negative Renal ROS  negative genitourinary   Musculoskeletal negative musculoskeletal ROS (+)   Abdominal   Peds negative pediatric ROS (+)  Hematology negative hematology ROS (+)   Anesthesia Other Findings   Reproductive/Obstetrics negative OB ROS                            Lab Results  Component Value Date   WBC 9.1 10/19/2015   HGB 11.7* 10/19/2015   HCT 35.9* 10/19/2015   MCV 100.0* 10/19/2015   PLT 162 10/19/2015   Lab Results  Component Value Date   CREATININE 0.9 10/19/2015   BUN 18.0 10/19/2015   NA 137 10/19/2015   K 3.9 10/19/2015   CL 99* 04/17/2015   CO2 25 10/19/2015   Lab Results  Component Value Date   INR 1.06 04/17/2015   INR 1.05 04/07/2015   INR 1.3 05/11/2007   10/2015 EKG: normal sinus rhythm, frequent PVC's noted.   Anesthesia Physical Anesthesia Plan  ASA: II  Anesthesia Plan: General   Post-op Pain Management:    Induction: Intravenous  Airway Management Planned: Oral ETT  Additional Equipment:   Intra-op Plan:   Post-operative Plan: Extubation in OR  Informed Consent: I have reviewed the patients History and Physical, chart, labs and discussed the procedure including the risks, benefits and alternatives for the proposed anesthesia with the patient or authorized  representative who has indicated his/her understanding and acceptance.   Dental advisory given  Plan Discussed with: CRNA  Anesthesia Plan Comments:         Anesthesia Quick Evaluation

## 2015-10-25 ENCOUNTER — Encounter (HOSPITAL_COMMUNITY): Payer: Self-pay | Admitting: Gastroenterology

## 2015-11-02 ENCOUNTER — Ambulatory Visit (HOSPITAL_BASED_OUTPATIENT_CLINIC_OR_DEPARTMENT_OTHER): Payer: Medicare Other | Admitting: Oncology

## 2015-11-02 ENCOUNTER — Ambulatory Visit: Payer: Medicare Other

## 2015-11-02 ENCOUNTER — Telehealth: Payer: Self-pay | Admitting: Oncology

## 2015-11-02 ENCOUNTER — Other Ambulatory Visit (HOSPITAL_BASED_OUTPATIENT_CLINIC_OR_DEPARTMENT_OTHER): Payer: Medicare Other

## 2015-11-02 VITALS — BP 150/85 | HR 78 | Temp 97.9°F | Resp 18 | Ht 72.0 in | Wt 132.7 lb

## 2015-11-02 DIAGNOSIS — E119 Type 2 diabetes mellitus without complications: Secondary | ICD-10-CM | POA: Diagnosis not present

## 2015-11-02 DIAGNOSIS — C787 Secondary malignant neoplasm of liver and intrahepatic bile duct: Secondary | ICD-10-CM

## 2015-11-02 DIAGNOSIS — Z452 Encounter for adjustment and management of vascular access device: Secondary | ICD-10-CM | POA: Diagnosis not present

## 2015-11-02 DIAGNOSIS — C251 Malignant neoplasm of body of pancreas: Secondary | ICD-10-CM | POA: Diagnosis present

## 2015-11-02 DIAGNOSIS — G629 Polyneuropathy, unspecified: Secondary | ICD-10-CM | POA: Diagnosis not present

## 2015-11-02 DIAGNOSIS — R634 Abnormal weight loss: Secondary | ICD-10-CM

## 2015-11-02 DIAGNOSIS — Z95828 Presence of other vascular implants and grafts: Secondary | ICD-10-CM

## 2015-11-02 DIAGNOSIS — I440 Atrioventricular block, first degree: Secondary | ICD-10-CM

## 2015-11-02 LAB — CBC WITH DIFFERENTIAL/PLATELET
BASO%: 0.7 % (ref 0.0–2.0)
BASOS ABS: 0.1 10*3/uL (ref 0.0–0.1)
EOS%: 0.6 % (ref 0.0–7.0)
Eosinophils Absolute: 0.1 10*3/uL (ref 0.0–0.5)
HCT: 38.3 % — ABNORMAL LOW (ref 38.4–49.9)
HGB: 12.3 g/dL — ABNORMAL LOW (ref 13.0–17.1)
LYMPH%: 7.4 % — AB (ref 14.0–49.0)
MCH: 31.9 pg (ref 27.2–33.4)
MCHC: 32.1 g/dL (ref 32.0–36.0)
MCV: 99.6 fL — AB (ref 79.3–98.0)
MONO#: 0.9 10*3/uL (ref 0.1–0.9)
MONO%: 7.6 % (ref 0.0–14.0)
NEUT#: 10 10*3/uL — ABNORMAL HIGH (ref 1.5–6.5)
NEUT%: 83.7 % — AB (ref 39.0–75.0)
PLATELETS: 139 10*3/uL — AB (ref 140–400)
RBC: 3.84 10*6/uL — AB (ref 4.20–5.82)
RDW: 14.7 % — ABNORMAL HIGH (ref 11.0–14.6)
WBC: 11.9 10*3/uL — ABNORMAL HIGH (ref 4.0–10.3)
lymph#: 0.9 10*3/uL (ref 0.9–3.3)

## 2015-11-02 LAB — COMPREHENSIVE METABOLIC PANEL
ALT: 73 U/L — AB (ref 0–55)
ANION GAP: 12 meq/L — AB (ref 3–11)
AST: 44 U/L — ABNORMAL HIGH (ref 5–34)
Albumin: 3.4 g/dL — ABNORMAL LOW (ref 3.5–5.0)
Alkaline Phosphatase: 244 U/L — ABNORMAL HIGH (ref 40–150)
BUN: 24.5 mg/dL (ref 7.0–26.0)
CHLORIDE: 98 meq/L (ref 98–109)
CO2: 27 meq/L (ref 22–29)
Calcium: 9.9 mg/dL (ref 8.4–10.4)
Creatinine: 0.8 mg/dL (ref 0.7–1.3)
EGFR: 84 mL/min/{1.73_m2} — AB (ref >90)
Glucose: 262 mg/dl — ABNORMAL HIGH (ref 70–140)
POTASSIUM: 3.5 meq/L (ref 3.5–5.1)
Sodium: 137 mEq/L (ref 136–145)
Total Bilirubin: 2.44 mg/dL — ABNORMAL HIGH (ref 0.20–1.20)
Total Protein: 6.3 g/dL — ABNORMAL LOW (ref 6.4–8.3)

## 2015-11-02 MED ORDER — HYDROCODONE-ACETAMINOPHEN 5-325 MG PO TABS: 1.0000 | ORAL_TABLET | ORAL | 0 refills | Status: AC | PRN

## 2015-11-02 MED ORDER — SODIUM CHLORIDE 0.9 % IJ SOLN
10.0000 mL | INTRAMUSCULAR | Status: DC | PRN
  Administered 2015-11-02: 10 mL via INTRAVENOUS
  Filled 2015-11-02: qty 10

## 2015-11-02 MED ORDER — HEPARIN SOD (PORK) LOCK FLUSH 100 UNIT/ML IV SOLN
500.0000 [IU] | Freq: Once | INTRAVENOUS | Status: AC | PRN
  Administered 2015-11-02: 500 [IU] via INTRAVENOUS
  Filled 2015-11-02: qty 5

## 2015-11-02 NOTE — Patient Instructions (Signed)
Acetaminophen; Hydrocodone tablets or capsules What is this medicine? ACETAMINOPHEN; HYDROCODONE (a set a MEE noe fen; hye droe KOE done) is a pain reliever. It is used to treat moderate to severe pain. This medicine may be used for other purposes; ask your health care provider or pharmacist if you have questions. What should I tell my health care provider before I take this medicine? They need to know if you have any of these conditions: -brain tumor -Crohn's disease, inflammatory bowel disease, or ulcerative colitis -drug abuse or addiction -head injury -heart or circulation problems -if you often drink alcohol -kidney disease or problems going to the bathroom -liver disease -lung disease, asthma, or breathing problems -an unusual or allergic reaction to acetaminophen, hydrocodone, other opioid analgesics, other medicines, foods, dyes, or preservatives -pregnant or trying to get pregnant -breast-feeding How should I use this medicine? Take this medicine by mouth. Swallow it with a full glass of water. Follow the directions on the prescription label. If the medicine upsets your stomach, take the medicine with food or milk. Do not take more than you are told to take. Talk to your pediatrician regarding the use of this medicine in children. This medicine is not approved for use in children. Patients over 65 years may have a stronger reaction and need a smaller dose. Overdosage: If you think you have taken too much of this medicine contact a poison control center or emergency room at once. NOTE: This medicine is only for you. Do not share this medicine with others. What if I miss a dose? If you miss a dose, take it as soon as you can. If it is almost time for your next dose, take only that dose. Do not take double or extra doses. What may interact with this medicine? -alcohol -antihistamines -isoniazid -medicines for depression, anxiety, or psychotic disturbances -medicines for  sleep -muscle relaxants -naltrexone -narcotic medicines (opiates) for pain -phenobarbital -ritonavir -tramadol This list may not describe all possible interactions. Give your health care provider a list of all the medicines, herbs, non-prescription drugs, or dietary supplements you use. Also tell them if you smoke, drink alcohol, or use illegal drugs. Some items may interact with your medicine. What should I watch for while using this medicine? Tell your doctor or health care professional if your pain does not go away, if it gets worse, or if you have new or a different type of pain. You may develop tolerance to the medicine. Tolerance means that you will need a higher dose of the medicine for pain relief. Tolerance is normal and is expected if you take the medicine for a long time. Do not suddenly stop taking your medicine because you may develop a severe reaction. Your body becomes used to the medicine. This does NOT mean you are addicted. Addiction is a behavior related to getting and using a drug for a non-medical reason. If you have pain, you have a medical reason to take pain medicine. Your doctor will tell you how much medicine to take. If your doctor wants you to stop the medicine, the dose will be slowly lowered over time to avoid any side effects. You may get drowsy or dizzy when you first start taking the medicine or change doses. Do not drive, use machinery, or do anything that may be dangerous until you know how the medicine affects you. Stand or sit up slowly. There are different types of narcotic medicines (opiates) for pain. If you take more than one type at the  same time, you may have more side effects. Give your health care provider a list of all medicines you use. Your doctor will tell you how much medicine to take. Do not take more medicine than directed. Call emergency for help if you have problems breathing. The medicine will cause constipation. Try to have a bowel movement at  least every 2 to 3 days. If you do not have a bowel movement for 3 days, call your doctor or health care professional. Too much acetaminophen can be very dangerous. Do not take Tylenol (acetaminophen) or medicines that contain acetaminophen with this medicine. Many non-prescription medicines contain acetaminophen. Always read the labels carefully. What side effects may I notice from receiving this medicine? Side effects that you should report to your doctor or health care professional as soon as possible: -allergic reactions like skin rash, itching or hives, swelling of the face, lips, or tongue -breathing problems -confusion -feeling faint or lightheaded, falls -stomach pain -yellowing of the eyes or skin Side effects that usually do not require medical attention (report to your doctor or health care professional if they continue or are bothersome): -nausea, vomiting -stomach upset This list may not describe all possible side effects. Call your doctor for medical advice about side effects. You may report side effects to FDA at 1-800-FDA-1088. Where should I keep my medicine? Keep out of the reach of children. This medicine can be abused. Keep your medicine in a safe place to protect it from theft. Do not share this medicine with anyone. Selling or giving away this medicine is dangerous and against the law. This medicine may cause accidental overdose and death if it taken by other adults, children, or pets. Mix any unused medicine with a substance like cat litter or coffee grounds. Then throw the medicine away in a sealed container like a sealed bag or a coffee can with a lid. Do not use the medicine after the expiration date. Store at room temperature between 15 and 30 degrees C (59 and 86 degrees F). NOTE: This sheet is a summary. It may not cover all possible information. If you have questions about this medicine, talk to your doctor, pharmacist, or health care provider.    2016,  Elsevier/Gold Standard. (2014-02-19 15:29:20)

## 2015-11-02 NOTE — Telephone Encounter (Signed)
No labs were asked for, gave pt cal & avs

## 2015-11-02 NOTE — Progress Notes (Signed)
[facility] OFFICE PROGRESS NOTE   Diagnosis: Pancreas cancer  INTERVAL HISTORY:   [name] returns as scheduled. He underwent an ERCP by Dr.Magod on 10/22/2015. A bile duct stricture was confirmed . A, bile duct stent was placed. There was some narrowing of the distal "bowled ". [name] reports improvement in the abdominal bloating following this procedure. He has a "cold "feeling in the mid upper back with mild back pain that makes it hard for him to begin comfortable at night. He is not taking pain medication. Stable neuropathy symptoms.  Objective:  Vital signs in last 24 hours:  Blood pressure (!) 150/85, pulse 78, temperature 97.9 F (36.6 C), temperature source Oral, resp. rate 18, height 6' (1.829 m), weight 132 lb 11.2 oz (60.2 kg), SpO2 99 %.    HEENT: Mild scleral icterus Resp: Lungs clear bilaterally Cardio: Regular rate and rhythm GI: No hepatosplenomegaly Vascular: No leg edema     Portacath/PICC-without erythema  Lab Results:  Lab Results  Component Value Date   WBC 11.9 (H) 11/02/2015   HGB 12.3 (L) 11/02/2015   HCT 38.3 (L) 11/02/2015   MCV 99.6 (H) 11/02/2015   PLT 139 (L) 11/02/2015   NEUTROABS 10.0 (H) 11/02/2015   Creatinine 0.8, alkaline phosphatase 244, bilirubin 2.44  Medications: I have reviewed the patient's current medications.  Assessment/Plan: 1. Metastatic pancreas cancer ? CT abdomen/pelvis 03/24/2015 confirmed a pancreas body mass with involvement of the celiac axis,SMV, and splenic vein. Multiple liver metastases ? Ultrasound-guided biopsy of a right liver lesion on 04/06/2014 confirmed metastatic adenocarcinoma ? Cycle 1 gemcitabine/Abraxane 04/20/2015 ? Cycle 2 gemcitabine/Abraxane 05/04/2015 ? Cycle 3 gemcitabine/Abraxane 05/18/2015 ? Cycle 4 gemcitabine/Abraxane 06/01/2015 ? Cycle 5 with gemcitabine alone 06/15/2015(Abraxane held due to progressive neuropathy symptoms) ? Restaging CT 06/25/2015-slight  enlargement of liver lesions, stable pancreas mass, slight decrease in the size of a gastrohepatic node ? Cycle 6 gemcitabine/Abraxane 06/29/2015 ? Cycle 7 gemcitabine/Abraxane 07/13/2015 ? Cycle 8 gemcitabine/Abraxane 07/27/2015 ? Cycle 9 gemcitabine/Abraxane 08/10/2015 ? Cycle 10 gemcitabine/Abraxane 08/24/2015 ? CTs 09/04/2015-improvement in size of liver metastases, stable pancreas mass ? Cycle 11 gemcitabine/Abraxane 09/07/2015 ? Cycle 12 gemcitabine/Abraxane 09/21/2015 ? CT 10/19/2015-slight progression of liver metastases, new intrahepatic and extrahepatic biliary duct dilatation  2. Weight loss secondary to metastatic pancreas cancer versus diabetes  3. Diabete  4. Peripheral neuropathy-predating chemotherapy  5. Right foot drop-improved, most likely related to peripheral nerve compression  6. Bradycardia-EKG 09/21/2015 reveals a sinus rhythm with second-degree AV block, verapamil discontinued; reading by cardiologist 09/21/2015 shows complete heart block new since previous tracing. Repeat EKG today shows first-degree AV block. Evaluated by cardiology.  7.   New-onset jaundice 10/19/2015-biliary dilatation confirmed on a CT 10/19/2015, bile duct stricture confirmed ERCP 10/22/2015, status post placement of a common bile duct stent  8.   Upper abdominal "bloating" and back pain secondary to pancreas cancer  Disposition:  [name] has metastatic pancreas cancer. There is clinical evidence of disease progression. I discussed treatment options with [name] and his wife. We discussed salvage chemotherapy versus comfort care. I reviewed the potential clinical benefit associated with salvage chemotherapy in this setting. [name] decided to proceed with comfort care.  He agrees to a [facility] referral. He was given a prescription for hydrocodone to use as needed for pain. We reviewed CPR and ACLS issues. He will be placed on a no CODE BLUE status.  Mr.  Chan will return for an office visit on 11/16/2015.  Betsy Coder,  MD  11/02/2015  6:19 PM

## 2015-11-02 NOTE — Patient Instructions (Signed)

## 2015-11-03 ENCOUNTER — Telehealth: Payer: Self-pay | Admitting: *Deleted

## 2015-11-03 LAB — CANCER ANTIGEN 19-9

## 2015-11-03 NOTE — Telephone Encounter (Signed)
Faxed hospice referral order to [facility].

## 2015-11-10 ENCOUNTER — Telehealth: Payer: Self-pay | Admitting: *Deleted

## 2015-11-10 NOTE — Telephone Encounter (Signed)
Message received from [place] with [facility] to inform Dr. Benay Spice that pt is c/o "no saliva" and his taste being off.  Biotene is not working.  Pt denies any pain or nausea per Ria Comment.  Ria Comment has instructed pt to use mouth swabs frequently and is wondering if anything else is available to help pt.  Per Dr. Benay Spice, pt is to continue mouth swabs as needed.  Ria Comment notified of MD orders.

## 2015-11-15 ENCOUNTER — Other Ambulatory Visit: Payer: Self-pay | Admitting: Oncology

## 2015-11-16 ENCOUNTER — Telehealth: Payer: Self-pay | Admitting: Oncology

## 2015-11-16 ENCOUNTER — Ambulatory Visit (HOSPITAL_BASED_OUTPATIENT_CLINIC_OR_DEPARTMENT_OTHER): Payer: Medicare Other | Admitting: Nurse Practitioner

## 2015-11-16 ENCOUNTER — Ambulatory Visit: Payer: Medicare Other

## 2015-11-16 ENCOUNTER — Other Ambulatory Visit: Payer: Medicare Other

## 2015-11-16 VITALS — BP 128/55 | HR 60 | Temp 98.5°F | Resp 17 | Ht 72.0 in | Wt 131.1 lb

## 2015-11-16 DIAGNOSIS — E119 Type 2 diabetes mellitus without complications: Secondary | ICD-10-CM | POA: Diagnosis not present

## 2015-11-16 DIAGNOSIS — G629 Polyneuropathy, unspecified: Secondary | ICD-10-CM

## 2015-11-16 DIAGNOSIS — C251 Malignant neoplasm of body of pancreas: Secondary | ICD-10-CM | POA: Diagnosis not present

## 2015-11-16 DIAGNOSIS — C787 Secondary malignant neoplasm of liver and intrahepatic bile duct: Secondary | ICD-10-CM | POA: Diagnosis not present

## 2015-11-16 NOTE — Progress Notes (Addendum)
[facility] OFFICE PROGRESS NOTE   Diagnosis:  Pancreas cancer  INTERVAL HISTORY:   [name] returns as scheduled. He reports an alteration in taste. He has fatigue. He had 3 bowel movements yesterday. He attributes this to eating "too much fiber". He typically has 1 bowel movement a day. No diarrhea. No abdominal or back pain.  Objective:  Vital signs in last 24 hours:  Blood pressure (!) 128/55, pulse 60, temperature 98.5 F (36.9 C), temperature source Oral, resp. rate 17, height 6' (1.829 m), weight 131 lb 1.6 oz (59.5 kg), SpO2 100 %.    HEENT: No thrush or ulcers. Resp: Lungs clear bilaterally. Cardio: Regular rate and rhythm. GI: Abdomen is nontender. Firm fullness medial right upper quadrant, question hepatomegaly. Vascular: No leg edema. Port-A-Cath without erythema.  Lab Results:  Lab Results  Component Value Date   WBC 11.9 (H) 11/02/2015   HGB 12.3 (L) 11/02/2015   HCT 38.3 (L) 11/02/2015   MCV 99.6 (H) 11/02/2015   PLT 139 (L) 11/02/2015   NEUTROABS 10.0 (H) 11/02/2015    Imaging:  No results found.  Medications: I have reviewed the patient's current medications.  Assessment/Plan: 1. Metastatic pancreas cancer ? CT abdomen/pelvis 03/24/2015 confirmed a pancreas body mass with involvement of the celiac axis,SMV, and splenic vein. Multiple liver metastases ? Ultrasound-guided biopsy of a right liver lesion on 04/06/2014 confirmed metastatic adenocarcinoma ? Cycle 1 gemcitabine/Abraxane 04/20/2015 ? Cycle 2 gemcitabine/Abraxane 05/04/2015 ? Cycle 3 gemcitabine/Abraxane 05/18/2015 ? Cycle 4 gemcitabine/Abraxane 06/01/2015 ? Cycle 5 with gemcitabine alone 06/15/2015(Abraxane held due to progressive neuropathy symptoms) ? Restaging CT 06/25/2015-slight enlargement of liver lesions, stable pancreas mass, slight decrease in the size of a gastrohepatic node ? Cycle 6 gemcitabine/Abraxane 06/29/2015 ? Cycle 7 gemcitabine/Abraxane  07/13/2015 ? Cycle 8 gemcitabine/Abraxane 07/27/2015 ? Cycle 9 gemcitabine/Abraxane 08/10/2015 ? Cycle 10 gemcitabine/Abraxane 08/24/2015 ? CTs 09/04/2015-improvement in size of liver metastases, stable pancreas mass ? Cycle 11 gemcitabine/Abraxane 09/07/2015 ? Cycle 12 gemcitabine/Abraxane 09/21/2015 ? CT 10/19/2015-slight progression of liver metastases, new intrahepatic and extrahepatic biliary duct dilatation  2. Weight loss secondary to metastatic pancreas cancer versus diabetes  3. Diabete  4. Peripheral neuropathy-predating chemotherapy  5. Right foot drop-improved, most likely related to peripheral nerve compression  6. Bradycardia-EKG 09/21/2015 reveals a sinus rhythm with second-degree AV block, verapamil discontinued; reading by cardiologist 09/21/2015 shows complete heart block new since previous tracing. Repeat EKG today shows first-degree AV block. Evaluated by cardiology.  7. New-onset jaundice 10/19/2015-biliary dilatation confirmed on a CT 10/19/2015, bile duct stricture confirmed ERCP 10/22/2015, status post placement of a common bile duct stent  8.   Upper abdominal "bloating" and back pain secondary to pancreas cancer   Disposition: [name] appears unchanged. He is enrolled in the [facility] hospice program. Plan to continue supportive/comfort care measures. We will reviewed his medication list and decided to discontinue pravastatin and Zetia.  He is interested in trying a "[place] supplement" to overall make him feel better. We will ask the [facility] pharmacist to review any available literature on the supplement prior to making a recommendation.  He will return for a follow-up visit in 3-4 weeks. He will contact the office in the interim with any problems.  Patient seen with Dr. Benay Spice.    Ned Card ANP/GNP-BC   11/16/2015  9:52 AM This was a shared visit with Ned Card. [name] appears to be slowly declining. He is now  enrolled in the [facility] hospice program.  [name], M.D.

## 2015-11-16 NOTE — Telephone Encounter (Signed)
Gave wife avs report and appointments for September

## 2015-12-03 ENCOUNTER — Telehealth: Payer: Self-pay | Admitting: *Deleted

## 2015-12-03 NOTE — Telephone Encounter (Signed)
Message from [place], [place] RN requesting recommendation for pt's indigestion. Reviewed with Dr. Benay Spice: Recommend OTC Maalox or Tums. Ria Comment made aware, she will call pt.

## 2015-12-14 ENCOUNTER — Ambulatory Visit (HOSPITAL_BASED_OUTPATIENT_CLINIC_OR_DEPARTMENT_OTHER): Payer: Medicare Other | Admitting: Oncology

## 2015-12-14 ENCOUNTER — Telehealth: Payer: Self-pay | Admitting: Oncology

## 2015-12-14 VITALS — BP 109/60 | HR 87 | Temp 97.6°F | Resp 17 | Ht 72.0 in | Wt 127.8 lb

## 2015-12-14 DIAGNOSIS — I440 Atrioventricular block, first degree: Secondary | ICD-10-CM

## 2015-12-14 DIAGNOSIS — G629 Polyneuropathy, unspecified: Secondary | ICD-10-CM | POA: Diagnosis not present

## 2015-12-14 DIAGNOSIS — C787 Secondary malignant neoplasm of liver and intrahepatic bile duct: Secondary | ICD-10-CM | POA: Diagnosis not present

## 2015-12-14 DIAGNOSIS — C251 Malignant neoplasm of body of pancreas: Secondary | ICD-10-CM

## 2015-12-14 DIAGNOSIS — E119 Type 2 diabetes mellitus without complications: Secondary | ICD-10-CM

## 2015-12-14 NOTE — Progress Notes (Signed)
  [name] OFFICE PROGRESS NOTE   Diagnosis: Pancreas cancer  INTERVAL HISTORY:   [name] returns as scheduled. He reports anorexia. No pain or nausea. He has difficulty with balance secondary to generalized loss of leg strength. He is using a cane to ambulate. The hospice nurse is visiting weekly.  Objective:  Vital signs in last 24 hours:  Blood pressure 109/60, pulse 87, temperature 97.6 F (36.4 C), temperature source Oral, resp. rate 17, height 6' (1.829 m), weight 127 lb 12.8 oz (58 kg), SpO2 100 %.    HEENT: No thrush or ulcers Resp: Lungs clear bilaterally Cardio: Irregular  GI: The liver is palpable in the right upper abdomen, no tenderness Vascular: Trace pitting edema at the lower leg bilaterally   Portacath/PICC-without erythema   Medications: I have reviewed the patient's current medications.  Assessment/Plan: 1. Metastatic pancreas cancer ? CT abdomen/pelvis 03/24/2015 confirmed a pancreas body mass with involvement of the celiac axis,SMV, and splenic vein. Multiple liver metastases ? Ultrasound-guided biopsy of a right liver lesion on 04/06/2014 confirmed metastatic adenocarcinoma ? Cycle 1 gemcitabine/Abraxane 04/20/2015 ? Cycle 2 gemcitabine/Abraxane 05/04/2015 ? Cycle 3 gemcitabine/Abraxane 05/18/2015 ? Cycle 4 gemcitabine/Abraxane 06/01/2015 ? Cycle 5 with gemcitabine alone 06/15/2015(Abraxane held due to progressive neuropathy symptoms) ? Restaging CT 06/25/2015-slight enlargement of liver lesions, stable pancreas mass, slight decrease in the size of a gastrohepatic node ? Cycle 6 gemcitabine/Abraxane 06/29/2015 ? Cycle 7 gemcitabine/Abraxane 07/13/2015 ? Cycle 8 gemcitabine/Abraxane 07/27/2015 ? Cycle 9 gemcitabine/Abraxane 08/10/2015 ? Cycle 10 gemcitabine/Abraxane 08/24/2015 ? CTs 09/04/2015-improvement in size of liver metastases, stable pancreas mass ? Cycle 11 gemcitabine/Abraxane 09/07/2015 ? Cycle 12 gemcitabine/Abraxane  09/21/2015 ? CT 10/19/2015-slight progression of liver metastases, new intrahepatic and extrahepatic biliary duct dilatation  2. Weight loss secondary to metastatic pancreas cancer versus diabetes  3. Diabete  4. Peripheral neuropathy-predating chemotherapy  5. Right foot drop-improved, most likely related to peripheral nerve compression  6. Bradycardia-EKG 09/21/2015 reveals a sinus rhythm with second-degree AV block, verapamil discontinued; reading by cardiologist 09/21/2015 shows complete heart block new since previous tracing. Repeat EKG today shows first-degree AV block. Evaluated by cardiology.  7. New-onset jaundice 10/19/2015-biliary dilatation confirmed on a CT 10/19/2015, bile duct stricture confirmed ERCP 10/22/2015, status post placement of a common bile duct stent  8. History of Upper abdominal "bloating" and back pain secondary to pancreas cancer    Disposition:  [name] appears to be slowly declining in the setting of metastatic pancreas cancer. He will continue follow-up with the [facility] program. He will return for an office visit in 6 weeks. He will contact us in the interim as needed.  The Port-A-Cath is being flushed by the hospice RN.  Betsy Coder, MD  12/14/2015  12:09 PM

## 2015-12-14 NOTE — Telephone Encounter (Signed)
Gave patient avs report and appointments for October  °

## 2015-12-24 ENCOUNTER — Telehealth: Payer: Self-pay

## 2015-12-24 NOTE — Telephone Encounter (Signed)
Faxed and mailed fmla paperwork to [employer] [facility] 12/18/15

## 2016-01-01 ENCOUNTER — Telehealth: Payer: Self-pay | Admitting: Oncology

## 2016-01-01 NOTE — Telephone Encounter (Signed)
Returned call to patient/ wife regarding 10/23 appointment. The wife of the patient called to cancel the appointment per the patient is receiving hospice care. There is no intent to reschedule.

## 2016-01-14 ENCOUNTER — Telehealth: Payer: Self-pay | Admitting: *Deleted

## 2016-01-14 NOTE — Telephone Encounter (Signed)
[facility] called reporting "Overnight  This patient has taken a turn, transitioning.  Terminal agitation and restlessness noted.  Dr. Lyman Speller would like to order Morphine and Haldol if Dr. Benay Spice would like." Called Dr. Benay Spice with this information.  Verbal order received and read back from Dr. Benay Spice to proceed with these orders.

## 2016-01-25 ENCOUNTER — Ambulatory Visit: Admitting: Nurse Practitioner

## 2016-02-03 DEATH — deceased

## 2016-03-12 ENCOUNTER — Other Ambulatory Visit: Payer: Self-pay | Admitting: Nurse Practitioner

## 2017-08-16 IMAGING — US US BIOPSY
1 series · 13 of 15 positions shown · non-contrast
Comparison: CT the abdomen pelvis - 03/24/2015

INDICATION: History of pancreatic head mass, now with multiple liver lesions
worrisome for metastatic disease. Please perform ultrasound-guided
liver lesion biopsy for tissue diagnostic purposes

EXAM:
ULTRASOUND GUIDED LIVER LESION BIOPSY

[Series 1: us biopsy · 0.22mm/px · 13 of 15 slices shown]
[im 1/15]
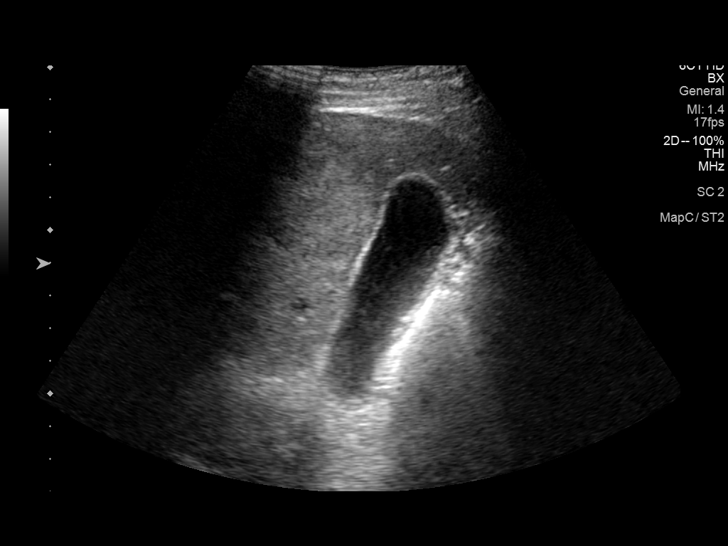
[im 2/15]
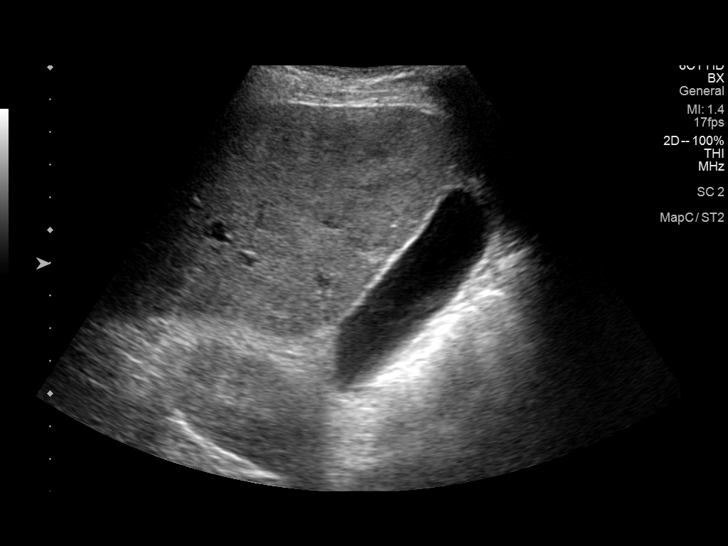
[im 3/15]
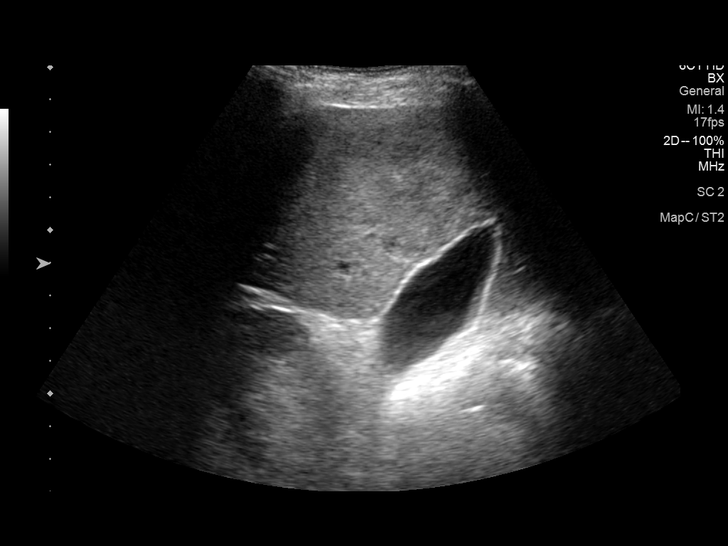
[im 5/15]
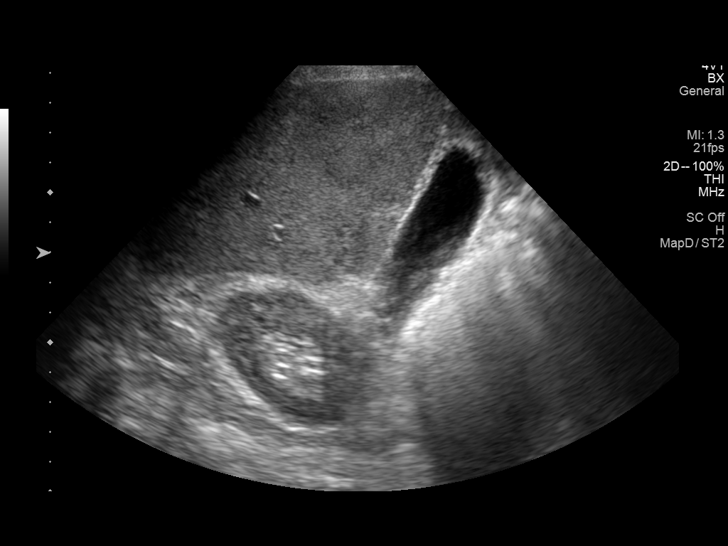
[im 6/15]
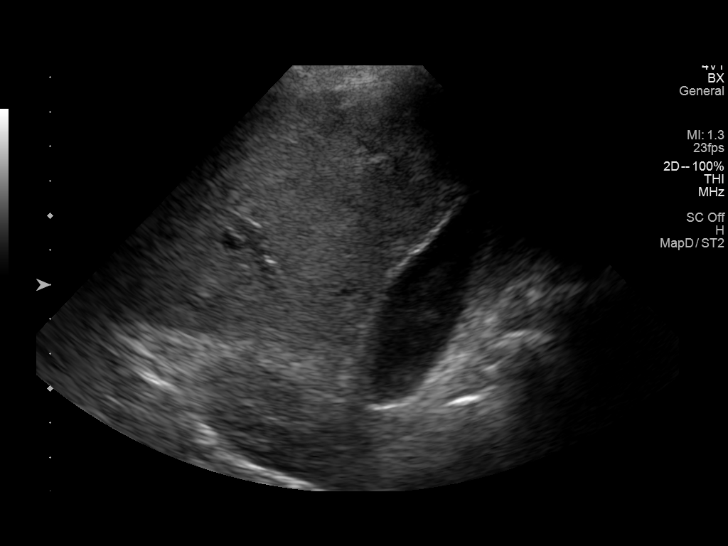
[im 7/15]
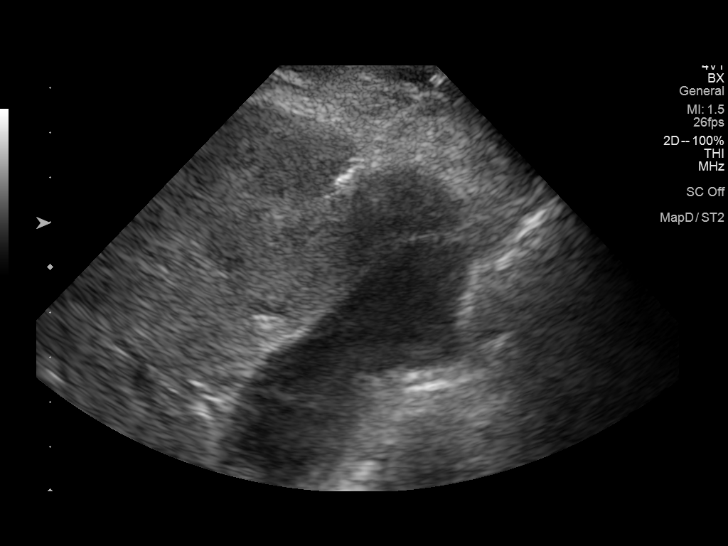
[im 8/15]
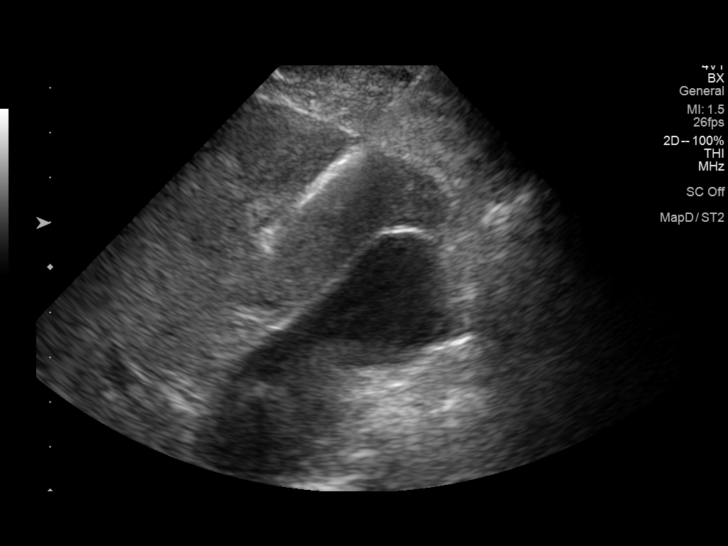
[im 9/15]
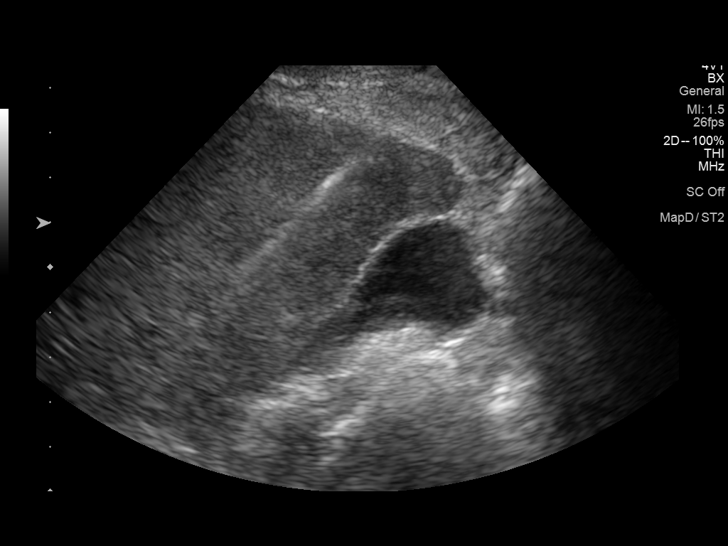
[im 10/15]
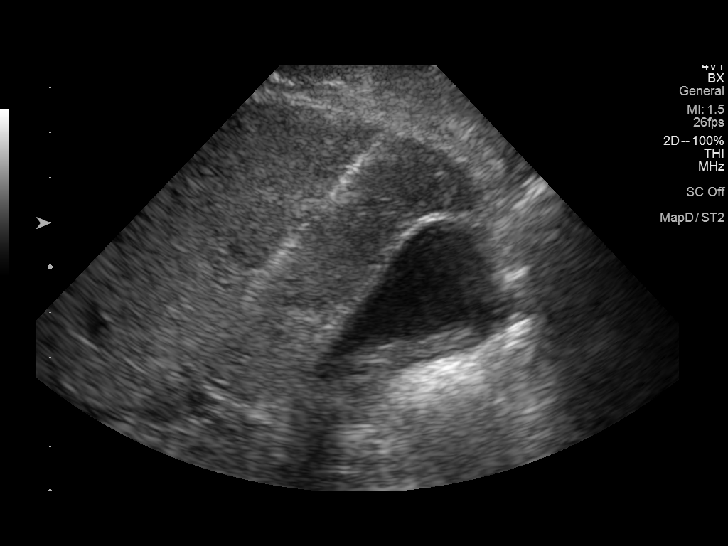
[im 11/15]
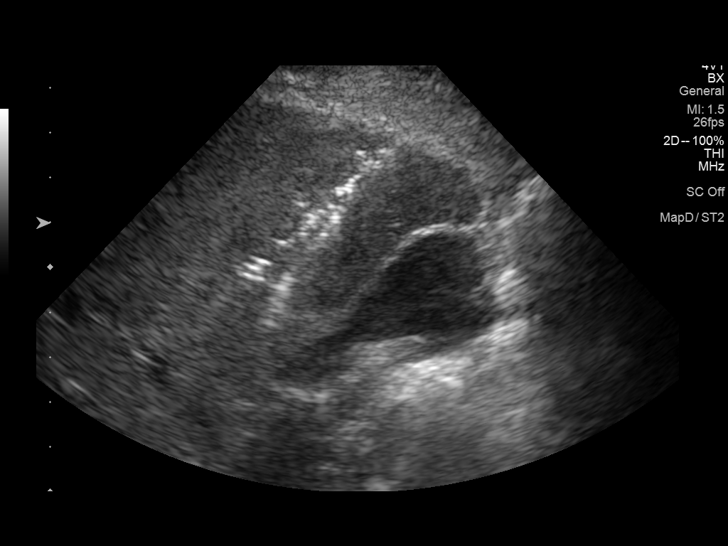
[im 13/15]
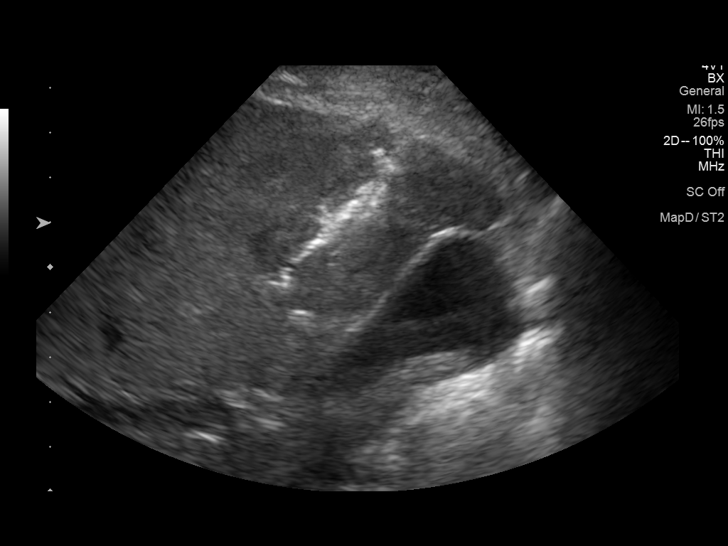
[im 14/15]
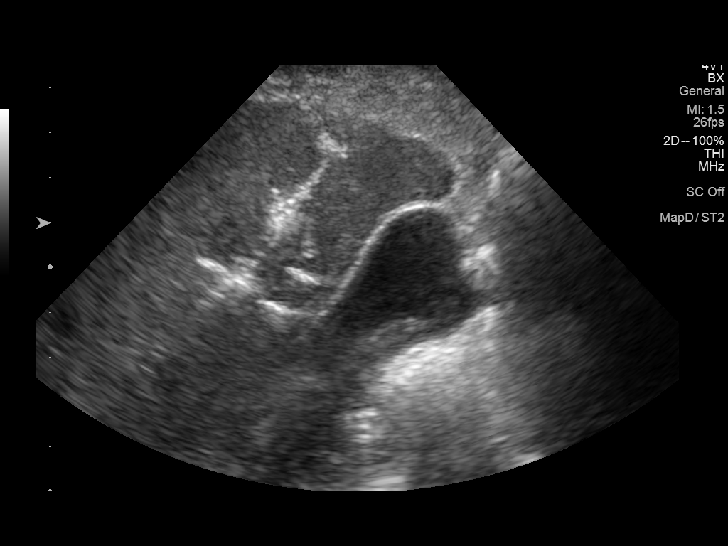
[im 15/15]
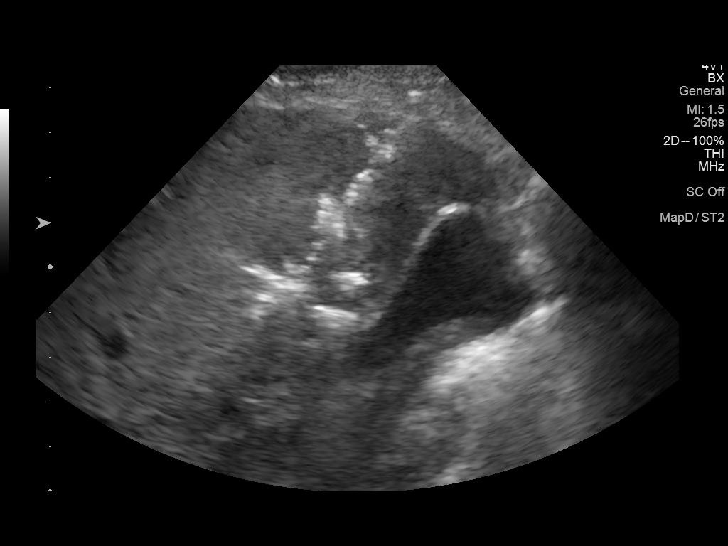

[13 of 15 positions shown; findings below may reference images not displayed]

MEDICATIONS:
Fentanyl 50 mcg IV; Versed 1.5 mg IV

ANESTHESIA/SEDATION:
Total Moderate Sedation time

11 minutes

COMPLICATIONS:
None immediate

PROCEDURE:
Informed written consent was obtained from the patient after a
discussion of the risks, benefits and alternatives to treatment. The
patient understands and consents the procedure. A timeout was
performed prior to the initiation of the procedure.

Ultrasound scanning was performed of the right upper abdominal
quadrant demonstrates an ill-defined mixed echogenic solid
approximately 3.7 x 1.3 cm mass within the anterior caudal aspect of
the right lobe of the liver adjacent to the gallbladder fossa (image
8) compatible with the dominant hypo attenuating lesion seen on
preceding abdominal CT image 25, series 3.

The procedure was planned. The right upper abdominal quadrant was
prepped and draped in the usual sterile fashion. The overlying soft
tissues were anesthetized with 1% lidocaine with epinephrine. A 17
gauge, 6.8 cm co-axial needle was advanced into a peripheral aspect
of the lesion. This was followed by 4 core biopsies with an 18 gauge
core device under direct ultrasound guidance.

The coaxial needle tract was embolized with a small amount of
Gel-Foam slurry and superficial hemostasis was obtained with manual
compression. Post procedural scanning was negative for definitive
area of hemorrhage or additional complication. A dressing was
placed. The patient tolerated the procedure well without immediate
post procedural complication.
IMPRESSION: Technically successful ultrasound guided core needle biopsy of
dominant mass within the anterior caudal aspect of the right lobe of
the liver adjacent to the gallbladder fossa.

## 2017-08-26 IMAGING — US IR FLUORO GUIDE CV LINE*R*
1 series · 1 of 1 positions shown · non-contrast
Comparison: none

CLINICAL DATA: Metastatic pancreas cancer

[Series 1: ir fluoro/shunt/fist · 1 of 1 slices shown]
[im 1/1]
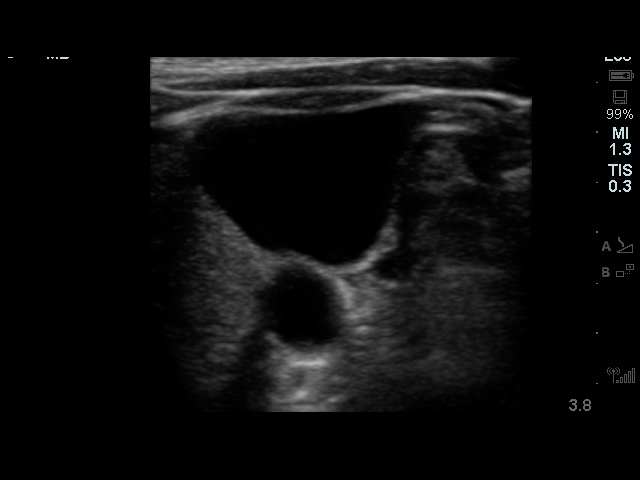

[1 of 1 positions shown; findings below may reference images not displayed]

EXAM:
RIGHT INTERNAL JUGULAR SINGLE LUMEN POWER PORT CATHETER INSERTION

Radiologist:  A[place]ton, Maximiliano

Guidance:  Ultrasound and fluoroscopic

FLUOROSCOPY TIME:  42 seconds 7.5 mGy

MEDICATIONS AND MEDICAL HISTORY:
2 g Ancefadministered within 1 hour of the procedure.1 mg Versed, 50
mcg fentanyl

ANESTHESIA/SEDATION:
25 minutes

CONTRAST:  None.

COMPLICATIONS:
None immediate

PROCEDURE:
Informed consent was obtained from the patient following explanation
of the procedure, risks, benefits and alternatives. The patient
understands, agrees and consents for the procedure. All questions
were addressed. A time out was performed.

Maximal barrier sterile technique utilized including caps, mask,
sterile gowns, sterile gloves, large sterile drape, hand hygiene,
and 2% chlorhexidine scrub.

Under sterile conditions and local anesthesia, right internal
jugular micropuncture venous access was performed. Access was
performed with ultrasound. Images were obtained for documentation. A
guide wire was inserted followed by a transitional dilator. This
allowed insertion of a guide wire and catheter into the IVC.
Measurements were obtained from the SVC / RA junction back to the
right IJ venotomy site. In the right infraclavicular chest, a
subcutaneous pocket was created over the second anterior rib. This
was done under sterile conditions and local anesthesia. 1% lidocaine
with epinephrine was utilized for this. A 2.5 cm incision was made
in the skin. Blunt dissection was performed to create a subcutaneous
pocket over the right pectoralis major muscle. The pocket was
flushed with saline vigorously. There was adequate hemostasis. The
port catheter was assembled and checked for leakage. The port
catheter was secured in the pocket with two retention sutures. The
tubing was tunneled subcutaneously to the right venotomy site and
inserted into the SVC/RA junction through a valved peel-away sheath.
Position was confirmed with fluoroscopy. Images were obtained for
documentation. The patient tolerated the procedure well. No
immediate complications. Incisions were closed in a two layer
fashion with 4 - 0 Vicryl suture. Dermabond was applied to the skin.
The port catheter was accessed, blood was aspirated followed by
saline and heparin flushes. Needle was removed. A dry sterile
dressing was applied.
IMPRESSION: Ultrasound and fluoroscopically guided right internal jugular single
lumen power port catheter insertion. Tip in the SVC/RA junction.
Catheter ready for use.

## 2018-02-27 IMAGING — CT CT ABDOMEN W/ CM
3 of 9 series · 13 of 46 positions shown, 18 images · IV contrast (iopamidol)
Comparison: 09/04/2015

CLINICAL DATA: Jaundice, question obstruction. Pancreatic cancer.
Chemotherapy in progress.

EXAM:
CT ABDOMEN WITH CONTRAST
TECHNIQUE: Multidetector CT imaging of the abdomen was performed using the
standard protocol following bolus administration of intravenous
contrast.
CONTRAST:  100mL E8GP2X-TRR IOPAMIDOL (E8GP2X-TRR) INJECTION 61%

[Series 2: pancreas arterial · axial · arterial · 0.75mm/px · z∈[-32,+130]mm · 7 of 74 slices shown]
[im 10/74  soft-tissue]
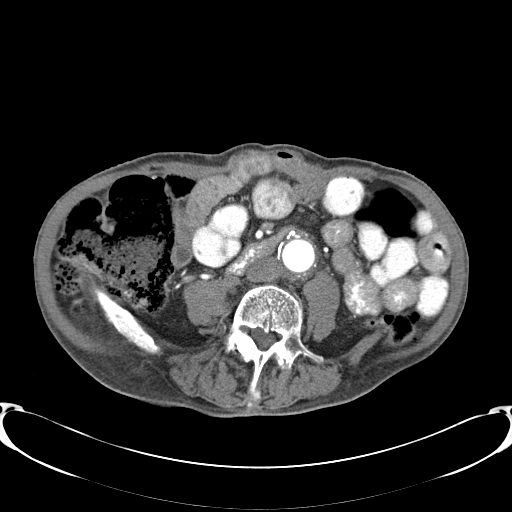
[im 19/74  soft-tissue]
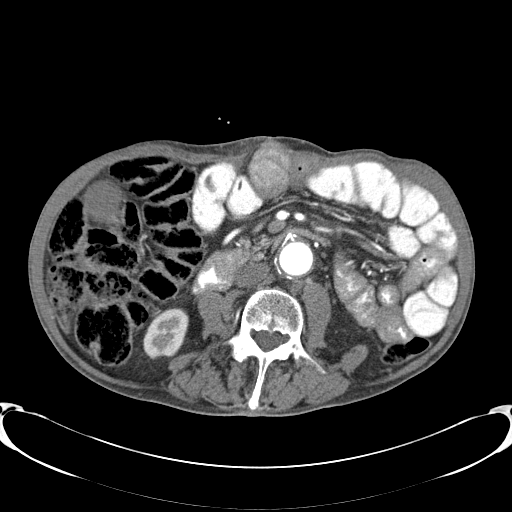
[im 28/74  soft-tissue]
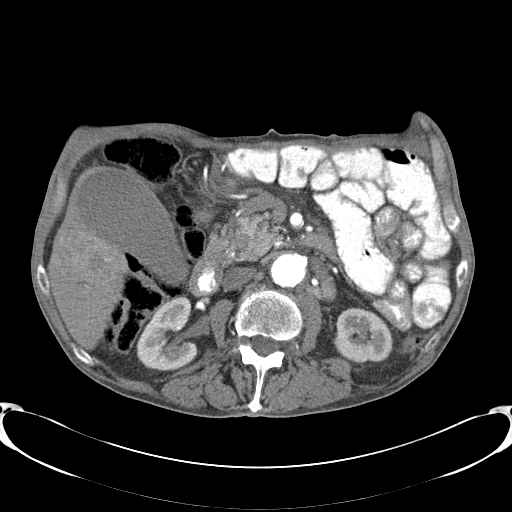
[im 37/74  soft-tissue]
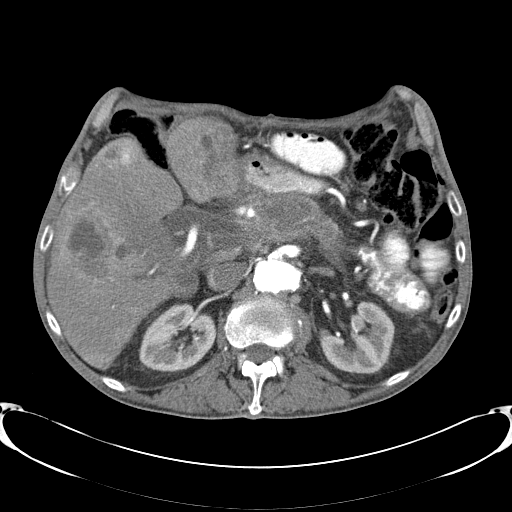
[im 46/74  soft-tissue]
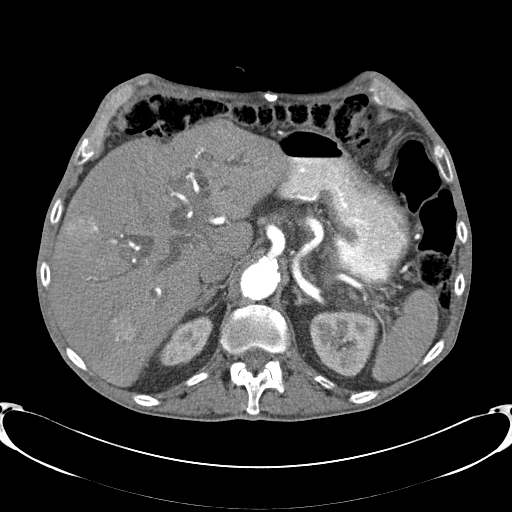
[im 55/74  soft-tissue]
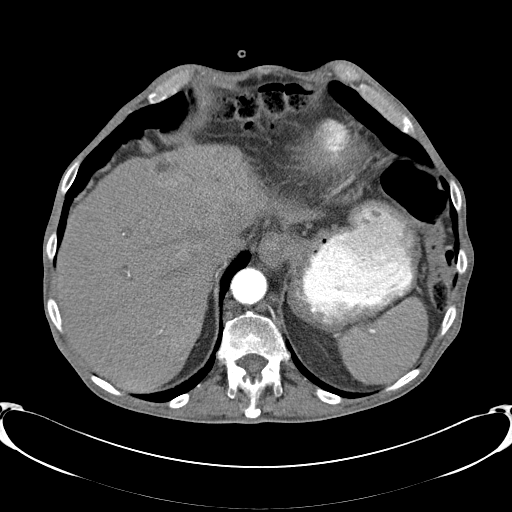
[im 64/74  soft-tissue]
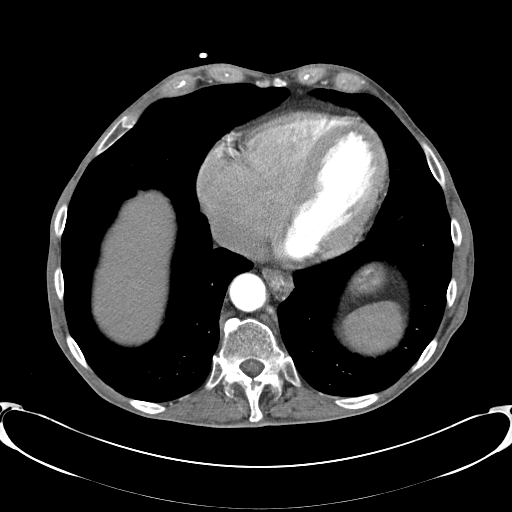

[Series 4: pancreas portal venous · axial · portal-venous · 0.75mm/px · z∈[-5,+105]mm · 3 of 45 slices shown, 7 images]
[im 12/45  soft-tissue]
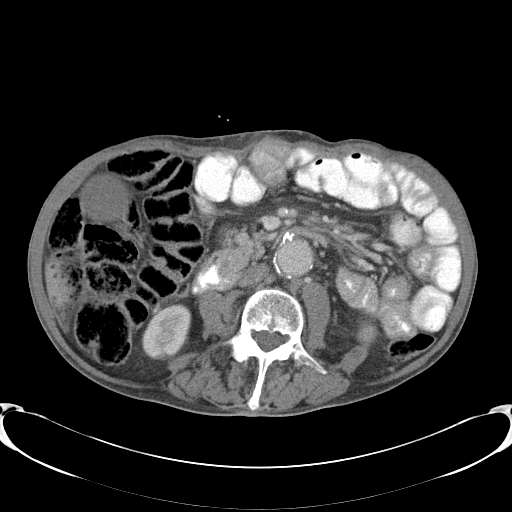
[im 12/45  lung]
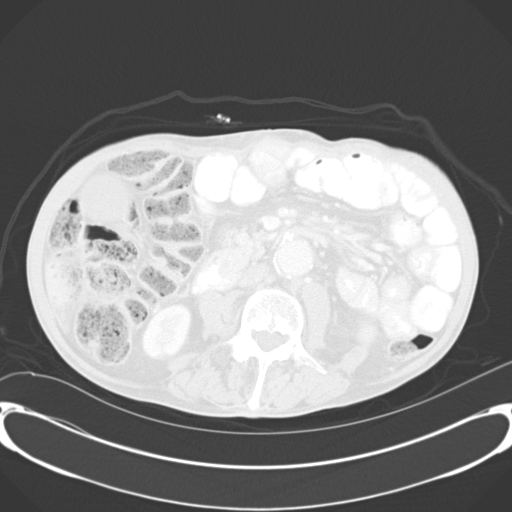
[im 12/45  bone]
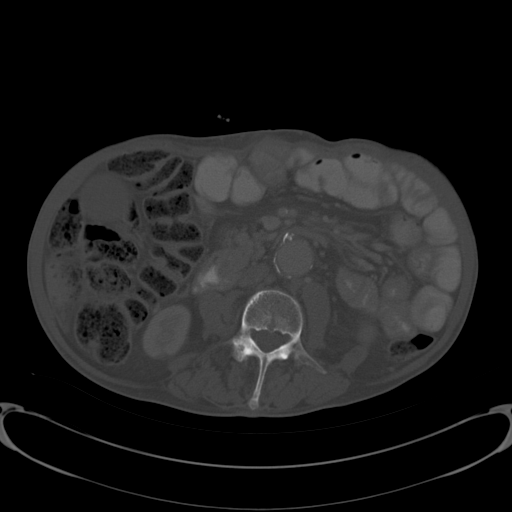
[im 23/45  soft-tissue]
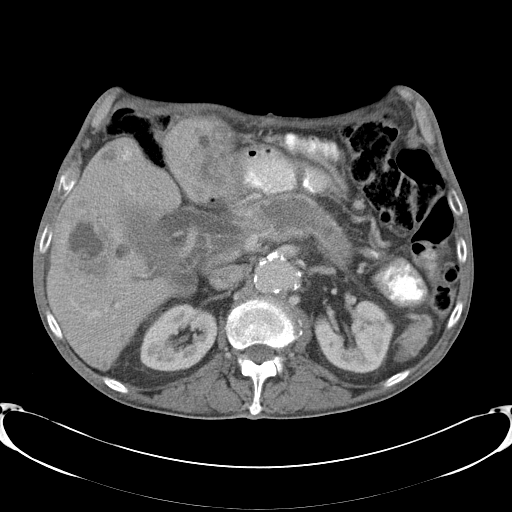
[im 23/45  lung]
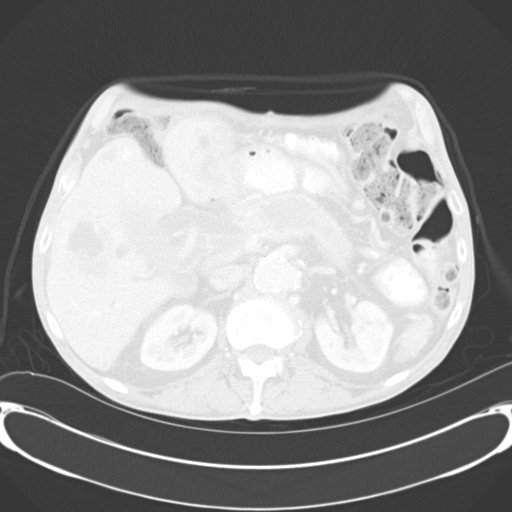
[im 34/45  soft-tissue]
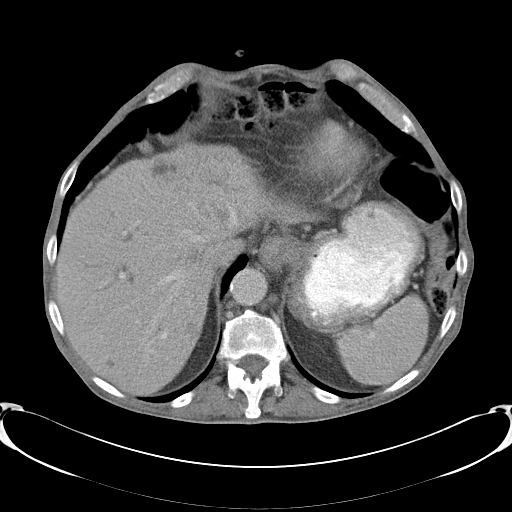
[im 34/45  lung]
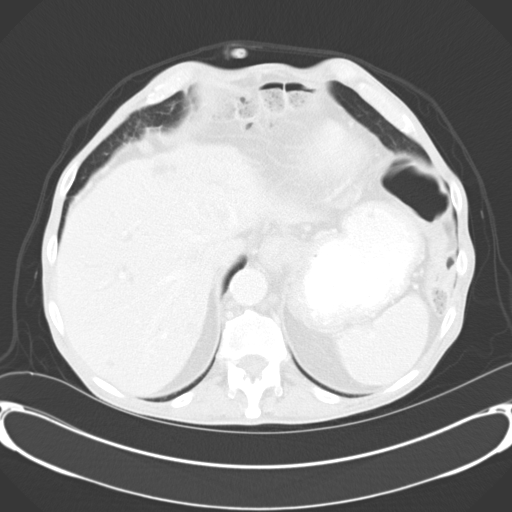

[Series 602: <mpr thick range> · coronal · 0.75mm/px · 3 of 132 slices shown, 4 images]
[im 33/132  soft-tissue]
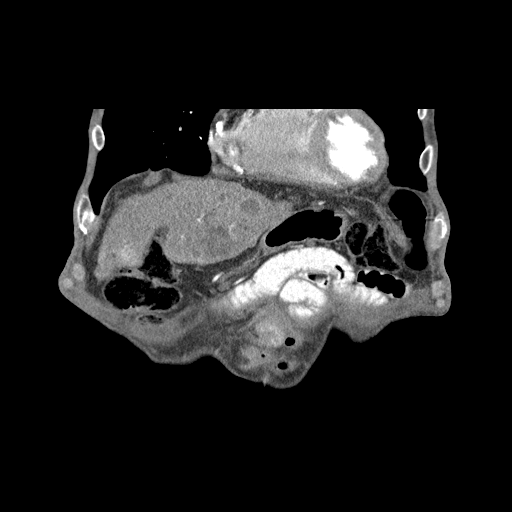
[im 66/132  soft-tissue]
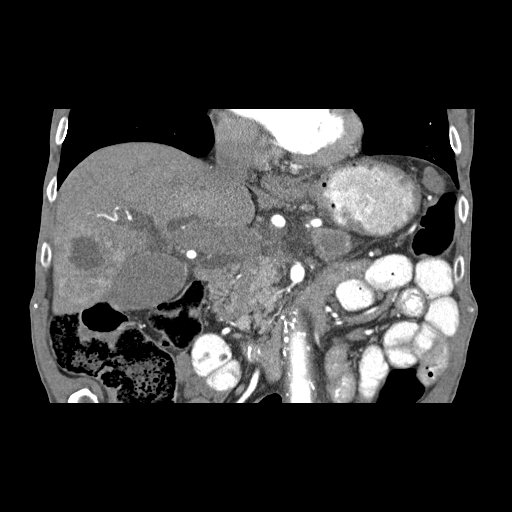
[im 66/132  bone]
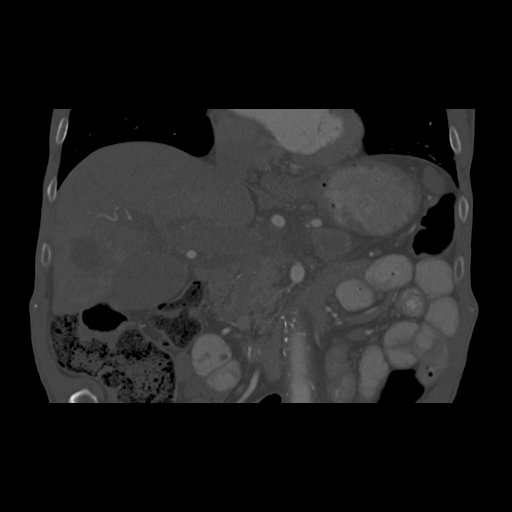
[im 99/132  soft-tissue]
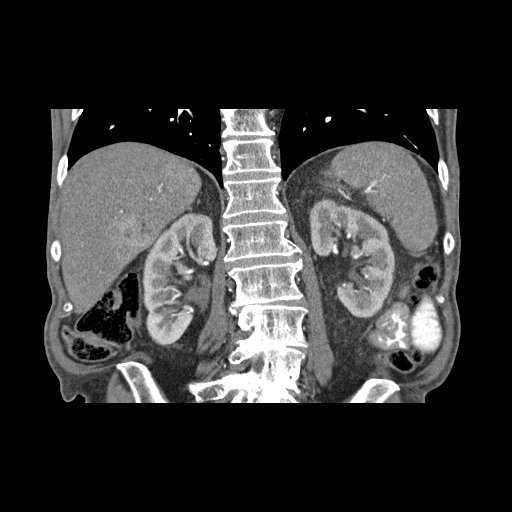

[13 of 46 positions shown; findings below may reference images not displayed]

FINDINGS: Lower chest: Small pulmonary nodules in the lower lobes measure up
to 5 mm in the right lower lobe (series 7, image 5), stable.
Three-vessel coronary artery calcification. Aortic valvular
calcification. Heart size normal. No pericardial or pleural
effusion.

Hepatobiliary: Ill-defined and heterogeneous masses in the liver may
have progressed slightly in the interval. Index left hepatic lobe
mass measures approximately 5.3 x 5.6 cm (series 4, image 21),
previously 4.6 x 4.8 cm when remeasured in a similar fashion. There
is intrahepatic and extrahepatic biliary ductal dilatation, new from
09/04/2015. Gallbladder is distended.

Pancreas: A mass in the pancreatic neck/body measures approximately
3.5 x 5.0 cm, stable to minimally enlarged, previously 3.1 x 4.2 cm.
Marked pancreatic ductal dilatation and gland atrophy in
association.

Spleen: Negative.

Adrenals/Urinary Tract: Adrenal glands are unremarkable. Sub cm
low-attenuation lesions in the kidneys are too small to
characterize.

Stomach/Bowel: Stomach and visualized portions of the small bowel
and colon are unremarkable.

Vascular/Lymphatic: Atherosclerotic calcification of the arterial
vasculature. Suprarenal aorta measures up to 3.5 cm. Peripancreatic
lymph nodes are best seen on coronal imaging, measuring up to 1.3 cm
(series 604, image 74).

Other: Surgical clips in the anterior right upper quadrant. No free
fluid. Mesenteries and peritoneum are otherwise grossly
unremarkable. Supraumbilical midline ventral hernia contains
unobstructed bowel.

Musculoskeletal: No worrisome lytic or sclerotic lesions.
Degenerative changes are seen in the spine.
IMPRESSION: 1. New extrahepatic and intrahepatic biliary ductal dilatation.
Suspect slight enlargement of a pancreatic mass and liver
metastases.
2. Aortic atherosclerosis, small abdominal aortic aneurysm and
three-vessel coronary artery calcification.

## 2018-08-10 ENCOUNTER — Encounter
